# Patient Record
Sex: Male | Born: 2000 | Race: Black or African American | Hispanic: No | Marital: Single
Health system: Northeastern US, Academic
[De-identification: ages and names within clinical notes are randomized; demographics above are authoritative.]

---
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
## 2020-03-09 ENCOUNTER — Encounter: Admit: 2020-03-09 | Payer: PRIVATE HEALTH INSURANCE | Primary: Internal Medicine

## 2020-03-09 ENCOUNTER — Inpatient Hospital Stay: Admit: 2020-03-09 | Discharge: 2020-03-09 | Payer: MEDICAID

## 2020-03-09 DIAGNOSIS — J45909 Unspecified asthma, uncomplicated: Secondary | ICD-10-CM

## 2020-03-09 NOTE — ED Provider Notes
HistoryChief Complaint Patient presents with ? Sore Throat   woke up this am with throat pain, pt has been experiencing nasal congestion with it. no fever reported.  19 year old male here for evaluation of sore throat.  He has had sore throat for last day or 2. He also complains of nasal congestion and postnasal drip.  He mostly has symptoms when he wakes up in the morning.  He describes as burning sensation when he swallows in the morning.  He also complains of sensation of itchy throat.  He had minimal cough.  He denies any fever.  He denies any nausea vomiting.  Patient is status post tonsillectomySore ThroatAssociated symptoms: postnasal drip and rhinorrhea  Associated symptoms: no abdominal pain, no chest pain, no chills, no cough, no fever, no headaches and no shortness of breath   Past Medical History: Diagnosis Date ? Asthma  Past Surgical History: Procedure Laterality Date ? TONSILLECTOMY AND ADENOIDECTOMY   Family History Problem Relation Age of Onset ? No Known Problems Mother  ? No Known Problems Father  Social History Socioeconomic History ? Marital status: Single   Spouse name: Not on file ? Number of children: Not on file ? Years of education: Not on file ? Highest education level: Not on file Tobacco Use ? Smoking status: Never Smoker ? Smokeless tobacco: Never Used Substance and Sexual Activity ? Alcohol use: Not Currently ED Other Social History ? E-cigarette status Never User  ? E-Cigarette Use Never User  E-cigarette/Vaping Substances E-cigarette/Vaping Devices Review of Systems Constitutional: Negative for chills and fever. HENT: Positive for congestion, postnasal drip, rhinorrhea, sneezing and sore throat.  Respiratory: Negative for cough and shortness of breath.  Cardiovascular: Negative for chest pain. Gastrointestinal: Negative for abdominal pain and blood in stool. Genitourinary: Negative for dysuria. Musculoskeletal: Negative for back pain. Neurological: Negative for headaches. All other systems reviewed and are negative. Physical ExamED Triage Vitals [03/09/20 1459]BP: (!) 138/96Pulse: 90Pulse from  O2 sat: n/aResp: 20Temp: 98.6 ?F (37 ?C)Temp src: OralSpO2: 96 % BP (!) 138/96  - Pulse 90  - Temp 98.6 ?F (37 ?C) (Oral)  - Resp 20  - Ht 6' 3 (1.905 m)  - Wt 122.5 kg  - SpO2 96%  - BMI 33.75 kg/m? Physical ExamVitals signs and nursing note reviewed. Constitutional:     Appearance: Normal appearance. HENT:    Head: Atraumatic.    Mouth/Throat:    Mouth: Mucous membranes are moist.    Pharynx: Oropharynx is clear. No pharyngeal swelling or uvula swelling ( Small uvula midline).    Tonsils: No tonsillar exudate. Eyes:    Conjunctiva/sclera: Conjunctivae normal. Neck:    Musculoskeletal: Normal range of motion. Cardiovascular:    Rate and Rhythm: Normal rate and regular rhythm. Pulmonary:    Effort: Pulmonary effort is normal. No respiratory distress.    Breath sounds: Normal breath sounds. Abdominal:    Tenderness: There is no abdominal tenderness. Musculoskeletal:       General: No swelling. Neurological:    General: No focal deficit present.    Mental Status: He is alert.  ProceduresProcedures ED COURSEReviewed previous: previous chartInterpreted by ED Provider: pulse oximetryPatient Reevaluation: 19 year old male with symptoms of sore throat and postnasal drip consistent with environmental allergies.  Will treat accordinglyCritical care provided by attending: no critical carePatient progress: stableClinical Impressions as of Mar 09 1528 Acute pharyngitis, unspecified etiology  ED DispositionNo disposition selected since last refresh of note. Andree Moro, DO04/04/21 1531

## 2020-03-09 NOTE — Discharge Instructions
Continue using your allergy medications including your NasacortTake Tylenol or Motrin as needed for any discomfortAvoid smoking as this can wait make your symptoms worse

## 2021-12-04 ENCOUNTER — Inpatient Hospital Stay: Admit: 2021-12-04 | Discharge: 2021-12-04 | Payer: MEDICAID | Primary: Internal Medicine

## 2021-12-04 ENCOUNTER — Encounter: Admit: 2021-12-04 | Payer: PRIVATE HEALTH INSURANCE | Attending: Internal Medicine | Primary: Internal Medicine

## 2021-12-04 DIAGNOSIS — R634 Abnormal weight loss: Secondary | ICD-10-CM

## 2021-12-04 DIAGNOSIS — R5383 Other fatigue: Secondary | ICD-10-CM

## 2021-12-04 LAB — CBC WITH AUTO DIFFERENTIAL
BKR ALKALINE PHOSPHATASE: 28.5 % (ref 45–117)
BKR WAM ABSOLUTE IMMATURE GRANULOCYTES.: 0.03 x 1000/??L (ref 0.00–0.30)
BKR WAM ABSOLUTE LYMPHOCYTE COUNT.: 2.06 x 1000/??L (ref 0.60–3.70)
BKR WAM ABSOLUTE NEUTROPHIL COUNT.: 3.59 x 1000/??L (ref 2.00–7.60)
BKR WAM BASOPHIL ABSOLUTE COUNT.: 0.05 x 1000/??L — ABNORMAL LOW (ref 5–15)
BKR WAM BASOPHILS: 0.7 % (ref 0.0–1.4)
BKR WAM EOSINOPHIL ABSOLUTE COUNT.: 0.48 x 1000/??L (ref 0.00–1.00)
BKR WAM EOSINOPHILS: 6.6 % — ABNORMAL HIGH (ref 0.0–5.0)
BKR WAM HEMATOCRIT (2 DEC): 43.3 % (ref 38.50–50.00)
BKR WAM HEMOGLOBIN: 14.1 g/dL (ref 13.2–17.1)
BKR WAM IMMATURE GRANULOCYTES: 0.4 % (ref 0.0–1.0)
BKR WAM LYMPHOCYTES: 28.5 % (ref 17.0–50.0)
BKR WAM MCH PG: 29.5 pg (ref 27.0–33.0)
BKR WAM MCV: 90.6 fL (ref 80.0–100.0)
BKR WAM MONOCYTE ABSOLUTE COUNT.: 1.03 x 1000/??L — ABNORMAL HIGH (ref 0.00–1.00)
BKR WAM MONOCYTES: 14.2 % — ABNORMAL HIGH (ref 4.0–12.0)
BKR WAM MPV: 8.9 fL (ref 8.0–12.0)
BKR WAM NEUTROPHILS: 49.6 % (ref 39.0–72.0)
BKR WAM NUCLEATED RED BLOOD CELLS: 0 % (ref 0.0–1.0)
BKR WAM PLATELETS: 340 x1000/??L (ref 150–420)
BKR WAM RDW-CV: 14 % (ref 11.0–15.0)
BKR WAM RED BLOOD CELL COUNT.: 4.78 M/??L (ref 4.00–6.00)
BKR WAM WHITE BLOOD CELL COUNT: 7.2 x1000/??L (ref 4.0–11.0)

## 2021-12-04 LAB — COMPREHENSIVE METABOLIC PANEL
BKR ALANINE AMINOTRANSFERASE (ALT): 42 U/L (ref 16–61)
BKR ALBUMIN: 3.7 g/dL (ref 3.4–5.0)
BKR ANION GAP (LM): 7 mmol/L (ref 5–15)
BKR ASPARTATE AMINOTRANSFERASE (AST): 27 U/L (ref 15–37)
BKR BILIRUBIN TOTAL: 0.3 mg/dL (ref <1.0)
BKR BLOOD UREA NITROGEN: 9 mg/dL (ref 7–18)
BKR CALCIUM: 9.1 mg/dL (ref 8.5–10.1)
BKR CHLORIDE: 103 mmol/L (ref 98–107)
BKR CO2: 30 mmol/L (ref 21–32)
BKR CREATININE: 1 mg/dL (ref 0.70–1.30)
BKR EGFR, CREATININE (CKD-EPI 2021): >60 mL/min/1.73m2 (ref >=60–1.4)
BKR GLOBULIN: 3.6 g/dL (ref 2.5–5.0)
BKR GLUCOSE: 98 mg/dL (ref 65–110)
BKR POTASSIUM: 4.1 mmol/L (ref 3.5–5.1)
BKR PROTEIN TOTAL: 7.3 g/dL (ref 6.4–8.2)
BKR SODIUM: 140 mmol/L (ref 136–145)
BKR WAM MCHC: 9.1 mg/dL (ref 8.5–10.1)

## 2021-12-04 LAB — TSH W/REFLEX TO FT4     (BH GH LMW Q YH): BKR THYROID STIMULATING HORMONE (LM): 2.25 u[IU]/mL (ref 0.36–3.74)

## 2021-12-05 LAB — HEMOGLOBIN A1C: BKR HEMOGLOBIN A1C: 5.4 % (ref 4.2–6.3)

## 2022-04-16 ENCOUNTER — Ambulatory Visit: Admit: 2022-04-16 | Payer: BLUE CROSS/BLUE SHIELD | Primary: Internal Medicine

## 2022-04-16 ENCOUNTER — Encounter: Admit: 2022-04-16 | Payer: PRIVATE HEALTH INSURANCE | Primary: Internal Medicine

## 2022-04-16 ENCOUNTER — Inpatient Hospital Stay: Admit: 2022-04-16 | Discharge: 2022-04-16 | Payer: BLUE CROSS/BLUE SHIELD | Primary: Internal Medicine

## 2022-04-16 DIAGNOSIS — R1110 Vomiting, unspecified: Secondary | ICD-10-CM

## 2022-04-16 DIAGNOSIS — K429 Umbilical hernia without obstruction or gangrene: Secondary | ICD-10-CM

## 2022-04-16 DIAGNOSIS — R109 Unspecified abdominal pain: Secondary | ICD-10-CM

## 2022-04-16 DIAGNOSIS — K567 Ileus, unspecified: Secondary | ICD-10-CM

## 2022-04-16 DIAGNOSIS — Z8709 Personal history of other diseases of the respiratory system: Secondary | ICD-10-CM

## 2022-04-16 DIAGNOSIS — J45909 Unspecified asthma, uncomplicated: Secondary | ICD-10-CM

## 2022-04-16 LAB — COMPREHENSIVE METABOLIC PANEL
BKR ALANINE AMINOTRANSFERASE (ALT): 60 U/L (ref 16–61)
BKR ALBUMIN: 3.7 g/dL (ref 3.4–5.0)
BKR ALKALINE PHOSPHATASE: 67 U/L (ref 45–117)
BKR ANION GAP (LM): 4 mmol/L — ABNORMAL LOW (ref 5–15)
BKR ASPARTATE AMINOTRANSFERASE (AST): 34 U/L (ref 15–37)
BKR BILIRUBIN TOTAL: 0.2 mg/dL (ref <1.0)
BKR BLOOD UREA NITROGEN: 7 mg/dL (ref 7–18)
BKR CALCIUM: 8.8 mg/dL (ref 8.5–10.1)
BKR CHLORIDE: 107 mmol/L (ref 98–107)
BKR CO2: 27 mmol/L (ref 21–32)
BKR CREATININE: 1 mg/dL (ref 0.70–1.30)
BKR EGFR, CREATININE (CKD-EPI 2021): >60 mL/min/{1.73_m2} (ref >=60)
BKR GLOBULIN: 3.6 g/dL (ref 2.5–5.0)
BKR GLUCOSE: 95 mg/dL (ref 65–110)
BKR POTASSIUM: 4 mmol/L (ref 3.5–5.1)
BKR PROTEIN TOTAL: 7.3 g/dL (ref 6.4–8.2)
BKR SODIUM: 138 mmol/L (ref 136–145)

## 2022-04-16 LAB — CBC WITH AUTO DIFFERENTIAL
BKR WAM ABSOLUTE IMMATURE GRANULOCYTES.: 0.03 x 1000/ÂµL (ref 0.00–0.30)
BKR WAM ABSOLUTE LYMPHOCYTE COUNT.: 1.85 x 1000/ÂµL (ref 0.60–3.70)
BKR WAM ABSOLUTE NEUTROPHIL COUNT.: 4.28 x 1000/ÂµL (ref 2.00–7.60)
BKR WAM BASOPHIL ABSOLUTE COUNT.: 0.03 x 1000/ÂµL (ref 0.00–1.00)
BKR WAM BASOPHILS: 0.4 % (ref 0.0–1.4)
BKR WAM EOSINOPHIL ABSOLUTE COUNT.: 0.23 x 1000/ÂµL (ref 0.00–1.00)
BKR WAM EOSINOPHILS: 3.3 % (ref 0.0–5.0)
BKR WAM HEMATOCRIT (2 DEC): 42.7 % (ref 38.50–50.00)
BKR WAM HEMOGLOBIN: 14.1 g/dL (ref 13.2–17.1)
BKR WAM IMMATURE GRANULOCYTES: 0.4 % (ref 0.0–1.0)
BKR WAM LYMPHOCYTES: 26.8 % (ref 17.0–50.0)
BKR WAM MCH PG: 30.6 pg (ref 27.0–33.0)
BKR WAM MCHC: 33 g/dL (ref 31.0–36.0)
BKR WAM MCV: 92.6 fL (ref 80.0–100.0)
BKR WAM MONOCYTE ABSOLUTE COUNT.: 0.49 x 1000/ÂµL (ref 0.00–1.00)
BKR WAM MONOCYTES: 7.1 % (ref 4.0–12.0)
BKR WAM MPV: 8.8 fL (ref 8.0–12.0)
BKR WAM NEUTROPHILS: 62 % (ref 39.0–72.0)
BKR WAM NUCLEATED RED BLOOD CELLS: 0 % (ref 0.0–1.0)
BKR WAM PLATELETS: 353 x1000/ÂµL (ref 150–420)
BKR WAM RDW-CV: 13.2 % (ref 11.0–15.0)
BKR WAM RED BLOOD CELL COUNT.: 4.61 M/ÂµL (ref 4.00–6.00)
BKR WAM WHITE BLOOD CELL COUNT: 6.9 x1000/ÂµL (ref 4.0–11.0)

## 2022-04-16 LAB — LIPASE: BKR LIPASE: 72 U/L — ABNORMAL LOW (ref 73–393)

## 2022-04-16 MED ORDER — ONDANSETRON HCL (PF) 4 MG/2 ML INJECTION SOLUTION
4 mg/2 mL | Freq: Once | INTRAVENOUS | Status: CP
Start: 2022-04-16 — End: ?
  Administered 2022-04-16: 20:00:00 4 mL via INTRAVENOUS

## 2022-04-16 MED ORDER — SODIUM CHLORIDE 0.9 % (FLUSH) INJECTION SYRINGE
0.9 % | INTRAVENOUS | Status: DC | PRN
Start: 2022-04-16 — End: 2022-04-17

## 2022-04-16 MED ORDER — SODIUM CHLORIDE 0.9 % BOLUS (NEW BAG)
0.9 % | Freq: Once | INTRAVENOUS | Status: CP
Start: 2022-04-16 — End: ?
  Administered 2022-04-16: 21:00:00 0.9 mL/h via INTRAVENOUS

## 2022-04-16 MED ORDER — METOCLOPRAMIDE 5 MG/ML INJECTION SOLUTION
5 mg/mL | Freq: Once | INTRAVENOUS | Status: CP
Start: 2022-04-16 — End: ?
  Administered 2022-04-16: 22:00:00 5 mL via INTRAVENOUS

## 2022-04-16 MED ORDER — KETOROLAC 15 MG/ML INJECTION SOLUTION
15 mg/mL | Freq: Once | INTRAVENOUS | Status: CP
Start: 2022-04-16 — End: ?
  Administered 2022-04-16: 20:00:00 15 mL via INTRAVENOUS

## 2022-04-16 MED ORDER — IOHEXOL 350 MG IODINE/ML INTRAVENOUS SOLUTION
350 mg iodine/mL | Freq: Once | INTRAVENOUS | Status: CP | PRN
Start: 2022-04-16 — End: ?
  Administered 2022-04-16: 21:00:00 350 mL via INTRAVENOUS

## 2022-04-16 MED ORDER — ONDANSETRON 4 MG DISINTEGRATING TABLET
4 mg | ORAL_TABLET | Freq: Three times a day (TID) | 1 refills | Status: AC | PRN
Start: 2022-04-16 — End: ?

## 2022-04-16 NOTE — ED Notes
4:40 PM IV placed on left AC.Patient received Toradol, Zofran and Fluids NS bolus. Patient vitals within normal limit, except BP slightly high.Patient was feeling nauseous today. Patient is aware he is going to [place] scan. 4:50 PMPatient is going to [place] scan.

## 2022-04-16 NOTE — Discharge Instructions
You were seen for your symptoms.  You may take over-the-counter Motrin Tylenol for pain or fever as needed.  May take Zofran as needed for nausea.  Drink plenty of fluids and get plenty of rest.  Follow-up with general surgery in 2 to 3 days or return to the ER if you have any worsening symptoms, fevers, chills, persistent vomiting, unable to drink any fluids, or any other worrisome symptoms.

## 2022-04-16 NOTE — ED Notes
3:33 PM Patient states he took a 5mg  prednisone of his mother's this morning for allergies.

## 2022-04-16 NOTE — ED Notes
6:16 PM Mom at bedside.7:18 PMD/C instructions given to patient. Prescriptions explained. Patient encouraged to f/u with PCP and return to ER for any worsening symptoms. PIV removed. Patient A/O, speech clear, on RA, ambulated off unit at d/c. Mom at bedside.

## 2022-04-16 NOTE — ED Provider Notes
HPI21 year old male with past medical history of asthma presents with abdominal pain for the past 2 to 3 days.  Patient states that he had stomach cramps yesterday which he would then noted to have 10 episodes of nonbloody, nonbilious emesis this morning.  States that he is having intermittent, sharp, abdominal pain which occurs about twice a day now and last about 5 minutes.  States that he took an allergy medication and steroids for his allergies.  Denies fevers, chills, chest pain, cold symptoms, shortness breath, testicular pain, penile pain, urinary symptoms, abdominal surgeries, sick contacts.PMH/PSH/FMH/Social Hx, medications and allergies are reviewed as documented in nursing notes.Allergies Allergen Reactions ? Peanut Anaphylaxis ? Tree Nut Anaphylaxis ? Seasonal [Environmental Allergies] Congestion ROS10 systems reviewed, pertinent positives per HPI otherwise noted to be negative.PHYSICAL EXAMVitals:  04/16/22 1529 04/16/22 1752 04/16/22 1753 BP: (!) 148/95 (!) 127/93 (!) 127/93 Pulse: 84 65 71 Resp: 16 20  Temp: 98.4 ?F (36.9 ?C)   TempSrc: Oral   SpO2: 98% 98%  Weight: 115.2 kg   Height: 6' 3 (1.905 m)   GENERAL APPEARANCE: A 21 y.o. male in no acute distress, non-toxic, non-hypoxic in appearance.HEENT: Head normocephalic, atraumatic. PULMONARY: Pt in no respiratory distress. CARDIOVASCULAR: Good capillary refill with no evidence of cyanosis.EXTREMITIES: Atraumatic x4, moving all extremities. No evidence of cyanosis or edema.ABDOMEN:NTTP 4 quadrants.Neg Murphy?s.Neg McBurney?s/Rovsing?s/psoas/obturator.Neg rebound.Neg heeltap.No palpable or pulsatile masses.SKIN: Warm, dry without rash, ecchymosis or erythema.NEUROLOGIC: Alert and oriented. No focal deficits.PSYCHIATRIC: Calm and pleasant affect, cooperative and interactive with staff.LABSI have reviewed all available labs for this visit prior to disposition.Results for orders placed or performed during the hospital encounter of 04/16/22 Lipase Result Value Ref Range  Lipase 72 (L) 73 - 393 U/L Comprehensive metabolic panel Result Value Ref Range  Sodium 138 136 - 145 mmol/L  Potassium 4.0 3.5 - 5.1 mmol/L  Chloride 107 98 - 107 mmol/L  CO2 27 21 - 32 mmol/L  Anion Gap 4 (L) 5 - 15 mmol/L  Glucose 95 65 - 110 mg/dL  BUN 7 7 - 18 mg/dL  Creatinine 1.61 0.96 - 1.30 mg/dL  Calcium 8.8 8.5 - 04.5 mg/dL  Total Protein 7.3 6.4 - 8.2 g/dL  Albumin 3.7 3.4 - 5.0 g/dL  Globulin 3.6 2.5 - 5.0 g/dL  Total Bilirubin 0.2 <4.0 mg/dL  Alkaline Phosphatase 67 45 - 117 U/L  Alanine Aminotransferase (ALT) 60 16 - 61 U/L  Aspartate Aminotransferase (AST) 34 15 - 37 U/L  eGFR (Creatinine) >60 >=60 mL/min/1.33m2 CBC auto differential Result Value Ref Range  WBC 6.9 4.0 - 11.0 x1000/?L  RBC 4.61 4.00 - 6.00 M/?L  Hemoglobin 14.1 13.2 - 17.1 g/dL  Hematocrit 98.11 91.47 - 50.00 %  MCV 92.6 80.0 - 100.0 fL  MCH 30.6 27.0 - 33.0 pg  MCHC 33.0 31.0 - 36.0 g/dL  RDW-CV 82.9 56.2 - 13.0 %  Platelets 353 150 - 420 x1000/?L  MPV 8.8 8.0 - 12.0 fL  Neutrophils 62.0 39.0 - 72.0 %  Lymphocytes 26.8 17.0 - 50.0 %  Monocytes 7.1 4.0 - 12.0 %  Eosinophils 3.3 0.0 - 5.0 %  Basophil 0.4 0.0 - 1.4 %  Immature Granulocytes 0.4 0.0 - 1.0 %  nRBC 0.0 0.0 - 1.0 %  ANC (Abs Neutrophil Count) 4.28 2.00 - 7.60 x 1000/?L  Absolute Lymphocyte Count 1.85 0.60 - 3.70 x 1000/?L  Monocyte Absolute Count 0.49 0.00 - 1.00 x 1000/?L  Eosinophil Absolute Count 0.23 0.00 - 1.00 x 1000/?L  Basophil Absolute Count 0.03  0.00 - 1.00 x 1000/?L  Absolute Immature Granulocyte Count 0.03 0.00 - 0.30 x 1000/?L RADIOLOGYCT Abdomen Pelvis w IV ContrastResult Date: 5/12/2023PROCEDURE:  [place] ABDOMEN PELVIS W IV CONTRAST CLINICAL INDICATION:  Periumbilical pain TECHNIQUE: Volumetric imaging was obtained from the lung bases through the pubic symphysis after the intravenous administration of 75 cc of Omnipaque 350. Oral contrast not employed. Imaging reviewed in the axial, coronal, and sagittal planes. COMPARISON:  No prior pertinent imaging available. FINDINGS: Included lung bases appear free of nodule or infiltrate.  No effusion seen within the included pleural spaces.  Heart size is within normal limits.  No effusion noted within the included pericardium. Liver demonstrates homogeneous enhancement without evidence of mass.  No intrahepatic biliary ductal dilatation noted.  Gallbladder appears unremarkable for modality. No splenic mass noted.  There is no discernible pancreatic mass.  No peripancreatic inflammatory changes noted. No renal mass noted.  No hydronephrosis noted. No adrenal mass noted.  No aortic aneurysm noted.  No significant aortic atherosclerotic disease noted.  No periaortic/caval adenopathy noted.  IVC demonstrates normal caliber. Stomach is poorly distended without discernible mass.  No abnormal small bowel dilatation noted.  Gas is seen scattered in the multiple loops of small bowel. Colon is suboptimally distended.  No pericolonic inflammatory changes are noted.  Vermiform appendix appears within normal limits.  No free intraperitoneal gas or significant free fluid noted.  There is a tiny fat-containing umbilical hernia. Herniated and. Umbilical subcutaneous adipose tissue demonstrates no abnormal stranding.  No pelvic mass noted.  The urinary bladder is suboptimally distended without discernible mass.  No presacral mass noted. No acute osseous fracture noted.  No destructive osseous process noted.  No vertebral body subluxation noted. Bowel gas pattern appears consistent with ileus. No evidence of bowel obstruction or perforated viscus. Small fat-containing umbilical hernia without evidence for associated edematous/inflammatory process. Q6578 - Total number of known Hernandez scans and cardiac nuclear medicine studies within the past 12 months: 1 I6962 - RADIATION DOSE ACQUIRED DURING SCAN: 762.64. [employer] strives for high quality imaging with the lowest possible radiation dose. For more information on medical radiation exposure please visit: [email or web address] . Reported and Signed by:  Herma Ard, MD ED COURSERelevant old records, labs and imaging are reviewed. During the patient's ED course, the patient was given:Medications sodium chloride 0.9 % flush 2.5 mL (has no administration in time range) sodium chloride 0.9 % (new bag) bolus 1,000 mL (0 mLs Intravenous Stopped 04/16/22 1752) ketorolac (TORadol) injection 15 mg (15 mg IV Push Given 04/16/22 1628) ondansetron (PF) (ZOFRAN) injection 4 mg (4 mg IV Push Given 04/16/22 1627) iohexoL (OMNIPAQUE) 350 mg iodine/mL injection 75 mL (75 mLs Intravenous Given 04/16/22 1651) sodium chloride 0.9 % (new bag) bolus 60 mL (0 mLs Intravenous Stopped 04/16/22 1702) metoclopramide (REGLAN) injection 10 mg (10 mg IV Push Given 04/16/22 1748)  Medical Decision Making81 year old male with past medical history of asthma presents with abdominal pain for the past 2 to 3 days.  Denies fevers, chills, chest pain, cold symptoms, shortness breath, testicular pain, penile pain, urinary symptoms, abdominal surgeries, sick contacts.Labs, Jennings abdomen pelvis with contrast pendingPatient treated with IV fluids, Zofran and Toradol.CBC no leukocytosis, no severe anemiaCMP no acute renal failure, no electrolyte abnormalities, no transaminitisLipase unremarkableUA negative for infection or bloodCT AP w/con significant for umbilical hernia and ileus, no other acute intra-abdominal or pelvic pathology.Patient also treated with Reglan 10 milligrams IV.Reviewed labs and imaging with patient, which she verbalized understanding  of.Amount and/or Complexity of Data ReviewedLabs: ordered.Radiology: ordered.RiskPrescription drug management.Nursing notes and vitals reviewed.Pt non-toxic, afebrile, and in no acute distress.VS stable during entire course of ED visit.Plan:OTC Motrin Tylenol.F/U with general surgery in 2-3 days.DCI and indications with which to return were discussed.All questions were answered prior to discharge.CLINICAL IMPRESSION  SNOMED [place](R) 1. Umbilical hernia without obstruction and without gangrene  UMBILICAL HERNIA 2. Ileus (HC Code) (HC CODE) (HC Code)  INTESTINAL OBSTRUCTION CO-OCCURRENT AND DUE TO DECREASED PERISTALSIS DISPOSITIONLamoy SEWELL PITNER was discharged to home in good condition.They have been instructed to follow-up with Cindi Carbon, Judeth Cornfield, MD[address] [phone] in 2 daysIf symptoms worsenCullen, Peter Garter, PASupervisionI have evaluated this patient; My supervising physician was available for consultation.Disclaimer: this chart was created using M-Modal dictation software.  Efforts were made by me to ensure accuracy, however some errors may be present due to limitations of this technology and occasionally words are not transcribed as intended. Evangeline Gula, PA05/12/23 1920

## 2022-04-17 ENCOUNTER — Encounter: Admit: 2022-04-17 | Payer: PRIVATE HEALTH INSURANCE | Primary: Internal Medicine

## 2022-04-17 ENCOUNTER — Inpatient Hospital Stay: Admit: 2022-04-17 | Discharge: 2022-04-18 | Payer: BLUE CROSS/BLUE SHIELD

## 2022-04-17 DIAGNOSIS — J45909 Unspecified asthma, uncomplicated: Secondary | ICD-10-CM

## 2022-04-17 MED ORDER — SODIUM CHLORIDE 0.9 % BOLUS (NEW BAG)
0.9 % | Freq: Once | INTRAVENOUS | Status: CP
Start: 2022-04-17 — End: ?
  Administered 2022-04-18: 03:00:00 0.9 mL/h via INTRAVENOUS

## 2022-04-17 MED ORDER — DICYCLOMINE 10 MG/ML INTRAMUSCULAR SOLUTION
10 mg/mL | Freq: Once | INTRAMUSCULAR | Status: CP
Start: 2022-04-17 — End: ?
  Administered 2022-04-18: 03:00:00 10 mL via INTRAMUSCULAR

## 2022-04-17 MED ORDER — KETOROLAC 15 MG/ML INJECTION SOLUTION
15 mg/mL | Freq: Once | INTRAVENOUS | Status: CP
Start: 2022-04-17 — End: ?
  Administered 2022-04-18: 03:00:00 15 mL via INTRAVENOUS

## 2022-04-17 MED ORDER — METOCLOPRAMIDE 5 MG/ML INJECTION SOLUTION
5 mg/mL | Freq: Once | INTRAVENOUS | Status: CP
Start: 2022-04-17 — End: ?
  Administered 2022-04-18: 03:00:00 5 mL via INTRAVENOUS

## 2022-04-18 LAB — CBC WITH AUTO DIFFERENTIAL
BKR WAM ABSOLUTE IMMATURE GRANULOCYTES.: 0.02 x 1000/ÂµL (ref 0.00–0.30)
BKR WAM ABSOLUTE LYMPHOCYTE COUNT.: 3.08 x 1000/ÂµL (ref 0.60–3.70)
BKR WAM ABSOLUTE NEUTROPHIL COUNT.: 4.21 x 1000/ÂµL (ref 2.00–7.60)
BKR WAM BASOPHIL ABSOLUTE COUNT.: 0.03 x 1000/ÂµL (ref 0.00–1.00)
BKR WAM BASOPHILS: 0.4 % (ref 0.0–1.4)
BKR WAM EOSINOPHIL ABSOLUTE COUNT.: 0.31 x 1000/ÂµL (ref 0.00–1.00)
BKR WAM EOSINOPHILS: 3.8 % (ref 0.0–5.0)
BKR WAM HEMATOCRIT (2 DEC): 41.5 % (ref 38.50–50.00)
BKR WAM HEMOGLOBIN: 13.8 g/dL (ref 13.2–17.1)
BKR WAM IMMATURE GRANULOCYTES: 0.2 % (ref 0.0–1.0)
BKR WAM LYMPHOCYTES: 37.4 % (ref 17.0–50.0)
BKR WAM MCH PG: 30.9 pg (ref 27.0–33.0)
BKR WAM MCHC: 33.3 g/dL (ref 31.0–36.0)
BKR WAM MCV: 93 fL (ref 80.0–100.0)
BKR WAM MONOCYTE ABSOLUTE COUNT.: 0.59 x 1000/ÂµL (ref 0.00–1.00)
BKR WAM MONOCYTES: 7.2 % (ref 4.0–12.0)
BKR WAM MPV: 9.4 fL (ref 8.0–12.0)
BKR WAM NEUTROPHILS: 51 % (ref 39.0–72.0)
BKR WAM NUCLEATED RED BLOOD CELLS: 0 % (ref 0.0–1.0)
BKR WAM PLATELETS: 358 x1000/ÂµL (ref 150–420)
BKR WAM RDW-CV: 13.1 % (ref 11.0–15.0)
BKR WAM RED BLOOD CELL COUNT.: 4.46 M/ÂµL (ref 4.00–6.00)
BKR WAM WHITE BLOOD CELL COUNT: 8.2 x1000/ÂµL (ref 4.0–11.0)

## 2022-04-18 LAB — COMPREHENSIVE METABOLIC PANEL
BKR ALANINE AMINOTRANSFERASE (ALT): 56 U/L (ref 16–61)
BKR ALBUMIN: 3.6 g/dL (ref 3.4–5.0)
BKR ALKALINE PHOSPHATASE: 63 U/L (ref 45–117)
BKR ANION GAP (LM): 6 mmol/L (ref 5–15)
BKR ASPARTATE AMINOTRANSFERASE (AST): 28 U/L (ref 15–37)
BKR BILIRUBIN TOTAL: 0.2 mg/dL (ref <1.0)
BKR BLOOD UREA NITROGEN: 12 mg/dL (ref 7–18)
BKR CALCIUM: 9.1 mg/dL (ref 8.5–10.1)
BKR CHLORIDE: 108 mmol/L — ABNORMAL HIGH (ref 98–107)
BKR CO2: 27 mmol/L (ref 21–32)
BKR CREATININE: 1.05 mg/dL (ref 0.70–1.30)
BKR EGFR, CREATININE (CKD-EPI 2021): >60 mL/min/{1.73_m2} (ref >=60)
BKR GLOBULIN: 3.5 g/dL (ref 2.5–5.0)
BKR GLUCOSE: 103 mg/dL (ref 65–110)
BKR POTASSIUM: 3.9 mmol/L (ref 3.5–5.1)
BKR PROTEIN TOTAL: 7.1 g/dL (ref 6.4–8.2)
BKR SODIUM: 141 mmol/L (ref 136–145)

## 2022-04-18 MED ORDER — DICYCLOMINE 20 MG TABLET
20 mg | ORAL_TABLET | Freq: Four times a day (QID) | ORAL | 1 refills | Status: AC | PRN
Start: 2022-04-18 — End: ?

## 2022-04-18 NOTE — Discharge Instructions
You were seen in the emergency department today for recurrence of abdominal pain after eating.  Your symptoms are most likely due to intestinal ileus.  Recommend clear liquid diet for the next 24 to 48 hours with advancement as tolerated.  You may continue to use Tylenol and Motrin as needed for pain.  You may use the prescription for the antinausea medication that was prescribed at your visit to the emergency department yesterday.  You may also use the prescription for Bentyl for treatment of abdominal pain/cramping every 6 hours as needed.  Follow-up with primary care provider as needed.  Follow-up with surgery for re-evaluation of small umbilical hernia to discuss definitive treatment as needed.  Return for new or worsening symptoms or if you have other concerns.

## 2022-04-18 NOTE — ED Notes
10:46 PM PA Candee at bedside. 12:30 AM+ results from pain/nausea medications (see MAR). Pt sleeping on stretcher, NAD. 2:07 AMDischarge paperwork provided, discussed. Pt expressed understanding.

## 2022-04-19 NOTE — ED Provider Notes
This patient was seen and evaluated by an advanced practice provider. Attending physician was available for consultation during the patient's course in the ED.CHIEF COMPLAINTGeneralized abdominal pain and nausea after eatingHPILamoy PERNELL Colon is a 21 y.o. male  who presents to the ED c/o generalized abdominal pain and nausea after he ate tuna fish this evening.  States that he was evaluated in the emergency department yesterday for similar symptoms in which he is having generalized abdominal pain and nausea and vomiting at that time, had labs and [place] scan was told that he had a hernia causing his symptoms.  He was told that if these symptoms worsened he should come in for re-evaluation.  Patient reports that he was feeling somewhat improved until he tried to eat tuna fish this evening and then he had pain that began about 20 to 30 minutes after eating that has been persistent since onset.  Describes pain as diffuse and aching with associated nausea but no vomiting.  Says that he is not had a bowel movement since yesterday and denies having any diarrhea or bloody or black stool.  No fevers or chills.  Urinating normally.  No medications prior to arrival to the ED.PMH/PSH/FMH/Social Hx, medications and allergies are reviewed as documented in nursing notes.Allergies Allergen Reactions ? Peanut Anaphylaxis ? Tree Nut Anaphylaxis ? Seasonal [Environmental Allergies] Congestion ROS10 systems reviewed, pertinent positives per HPI otherwise noted to be negative.PHYSICAL EXAMPatient Vitals for the past 24 hrs: BP Temp Temp src Pulse Resp SpO2 Weight 04/17/22 2300 135/83 -- -- -- -- 97 % -- 04/17/22 2240 (!) 150/83 98.1 ?F (36.7 ?C) Oral 78 20 98 % 132.4 kg GENERAL APPEARANCE: A 21 y.o. male in no acute distress, non-toxic, non-hypoxic in appearance.HEENT: Head normocephalic, atraumatic. PULMONARY: Pt in no respiratory distress. CTAB.CARDIOVASCULAR: Good capillary refill with no evidence of cyanosis. RRRABDOMEN: Soft, non-distended.  Mildly tender to palpation diffusely over the abdomen. No masses. BSx4. No rebound/guarding/peritoneal signs. Murphy's sign negative.BACK: No CVAT.EXTREMITIES: Atraumatic x4, moving all extremities. No evidence of cyanosis or edema.SKIN: Warm, dry without rash, ecchymosis or erythema.NEUROLOGIC: Alert and oriented. No focal deficits.PSYCHIATRIC: Calm and pleasant affect, cooperative and interactive with staff.LABSI have reviewed all available labs for this visit prior to disposition.Results for orders placed or performed during the hospital encounter of 04/17/22 Comprehensive metabolic panel Result Value Ref Range  Sodium 141 136 - 145 mmol/L  Potassium 3.9 3.5 - 5.1 mmol/L  Chloride 108 (H) 98 - 107 mmol/L  CO2 27 21 - 32 mmol/L  Anion Gap 6 5 - 15 mmol/L  Glucose 103 65 - 110 mg/dL  BUN 12 7 - 18 mg/dL  Creatinine 1.19 1.47 - 1.30 mg/dL  Calcium 9.1 8.5 - 82.9 mg/dL  Total Protein 7.1 6.4 - 8.2 g/dL  Albumin 3.6 3.4 - 5.0 g/dL  Globulin 3.5 2.5 - 5.0 g/dL  Total Bilirubin 0.2 <5.6 mg/dL  Alkaline Phosphatase 63 45 - 117 U/L  Alanine Aminotransferase (ALT) 56 16 - 61 U/L  Aspartate Aminotransferase (AST) 28 15 - 37 U/L  eGFR (Creatinine) >60 >=60 mL/min/1.50m2 CBC auto differential Result Value Ref Range  WBC 8.2 4.0 - 11.0 x1000/?L  RBC 4.46 4.00 - 6.00 M/?L  Hemoglobin 13.8 13.2 - 17.1 g/dL  Hematocrit 21.30 86.57 - 50.00 %  MCV 93.0 80.0 - 100.0 fL  MCH 30.9 27.0 - 33.0 pg  MCHC 33.3 31.0 - 36.0 g/dL  RDW-CV 84.6 96.2 - 95.2 %  Platelets 358 150 - 420 x1000/?L  MPV 9.4 8.0 -  12.0 fL  Neutrophils 51.0 39.0 - 72.0 %  Lymphocytes 37.4 17.0 - 50.0 %  Monocytes 7.2 4.0 - 12.0 %  Eosinophils 3.8 0.0 - 5.0 %  Basophil 0.4 0.0 - 1.4 %  Immature Granulocytes 0.2 0.0 - 1.0 %  nRBC 0.0 0.0 - 1.0 %  ANC (Abs Neutrophil Count) 4.21 2.00 - 7.60 x 1000/?L Absolute Lymphocyte Count 3.08 0.60 - 3.70 x 1000/?L  Monocyte Absolute Count 0.59 0.00 - 1.00 x 1000/?L  Eosinophil Absolute Count 0.31 0.00 - 1.00 x 1000/?L  Basophil Absolute Count 0.03 0.00 - 1.00 x 1000/?L  Absolute Immature Granulocyte Count 0.02 0.00 - 0.30 x 1000/?L RADIOLOGYNo orders to display ED COURSERelevant old records, labs and imaging are reviewed. During the patient's ED course, the patient was given:Medications ketorolac (TORadol) injection 15 mg (15 mg IV Push Given 04/17/22 2305) metoclopramide (REGLAN) injection 10 mg (10 mg Intravenous Given 04/17/22 2305) dicyclomine (BENTYL) injection 20 mg (20 mg Intramuscular Given 04/17/22 2305) sodium chloride 0.9 % (new bag) bolus 500 mL (0 mLs Intravenous Stopped 04/18/22 0144)  MDMLamoy O Heffron is a 21 y.o. male presents to the Emergency Department c/o recurrence of diffuse abdominal pain and nausea after he attempted to eat tuna fish this evening.  He was concerned that his umbilical hernia is worsening and came back for evaluation.  Patient was evaluated just yesterday for nausea, vomiting, and diffuse abdominal pain, had [place] scan demonstrating intestinal ileus and small fat containing umbilical hernia.  He apparently was given medications which improved his pain discharge home with Zofran and was feeling well until he attempted to eat this evening. Vital signs are stable and he is afebrile.  Overall appears well, nontoxic in appearance.  Exam today is unremarkable with mild diffuse tenderness without peritoneal signs and positive bowel sounds.  Remainder of exam is unremarkable.  His repeat labs today are without abnormality.  Discussed with the patient that his symptoms have recurrence of pain after eating are more likely related to spasm secondary to eating likely in regards to current ileus.  He says that he has not really been passing much gas and has not had a bowel movement since yesterday.  Advised on clear liquid diet.  He was administered antinausea medication, Toradol, dicyclomine and fluid bolus and felt improved.  He was advised on continued use of Bentyl, clear liquid diet, use of the prescribed Zofran that was prescribed yesterday at his visit and close follow-up with primary care provider.  He was advised that he may also follow-up with surgery as an outpatient for elective repair of the small umbilical hernia.  Patient is discharged in good condition.Medications prescribed at this visit:1) Bentyl 20mg  q6h prnDCI and indications with which to return were discussed and all questions were answered prior to discharge.CLINICAL IMPRESSION  SNOMED [place](R) 1. Ileus (HC Code) (HC CODE) (HC Code)  INTESTINAL OBSTRUCTION CO-OCCURRENT AND DUE TO DECREASED PERISTALSIS 2. Generalized abdominal pain  GENERALIZED ABDOMINAL PAIN 3. Nausea  NAUSEA 4. Umbilical hernia without obstruction and without gangrene  UMBILICAL HERNIA DISPOSITIONLamoy Ramiro Harvest was discharged to home in good condition.They have been instructed to follow-up with Charlott Holler [address] [phone]: This chart was created using M-Modal dictation software. Efforts were made by me to ensure accuracy, however some errors may be present due to limitations of this technology and occasionally words are not transcribed as intended. Caswell Corwin, PA05/14/23 2209

## 2022-06-17 ENCOUNTER — Institutional Professional Consult (permissible substitution)
Admit: 2022-06-17 | Payer: BLUE CROSS/BLUE SHIELD | Attending: Student in an Organized Health Care Education/Training Program | Primary: Internal Medicine

## 2022-06-17 DIAGNOSIS — K429 Umbilical hernia without obstruction or gangrene: Secondary | ICD-10-CM

## 2022-06-17 MED ORDER — SERTRALINE 50 MG TABLET
50 mg | ORAL | Status: AC
Start: 2022-06-17 — End: ?

## 2022-06-17 MED ORDER — PANTOPRAZOLE 40 MG TABLET,DELAYED RELEASE
40 mg | Status: AC
Start: 2022-06-17 — End: ?

## 2022-06-17 NOTE — Other
MRN: [id number] Date of Encounter: 7/13/2023Indication for Visit:  Umbilical HerniaReferred by: Evan Hey, PA   HPI:  21yo obese man with well-controlled asthma presenting for evaluation of umbilical hernia. Was seen in [place] two months ago with crampy abdominal pain and emesis; work-up included labs that were unremarkable and a CTAP with no acute findings and a small fat-containing umbilical hernia. Prior to this CTAP, Evan Colon was not aware that he had a hernia. Reports that he still occasionally has crampy abdominal pain but improved with rx for bentyl; no nausea or vomiting, no diarrhea or constipation. Will be seeing GI in future due to significant GERD, for which he is taking pantoprazole with dose recently increased to BID. No cp/sob. Only has rescue inhaler for asthma and cannot remember the last time he used itSnores at night and mother sometimes has to wake up him to have him turn; had sleep study in his teens, no OSA at that time.No tobacco useRare EtOHDaily marijuana smokerET> 4 METSPAST MEDICAL: Past Medical History: Diagnosis Date ? Asthma  SURGICAL HISTORY:  Past Surgical History: Procedure Laterality Date ? TONSILLECTOMY AND ADENOIDECTOMY   MEDICATIONS:  Current Outpatient Medications Medication Sig ? ALBUTEROL INHL Inhale into the lungs. ? cetirizine (ZYRTEC) 10 mg tablet Take 1 tablet (10 mg total) by mouth daily. ? dicyclomine (BENTYL) 20 mg tablet Take 1 tablet (20 mg total) by mouth every 6 (six) hours as needed (abdominal pain/cramping). ? pantoprazole (PROTONIX) 40 mg tablet  ? sertraline (ZOLOFT) 50 mg tablet Take 1 tablet (50 mg total) by mouth daily. No current facility-administered medications for this visit. ALLERGIES: is allergic to peanut, peanut oil, tree nut, and seasonal [environmental allergies]. FAMILY HISTORY:   Family History Problem Relation Age of Onset ? No Known Problems Mother  ? No Known Problems Father  	SOCIAL HISTORY:  He reports that he has never smoked. He has never used smokeless tobacco. He reports that he does not currently use alcohol. No history on file for drug use.REVIEW OF SYSTEMS: Negative except as per HPIPHYSICAL EXAMINATION:VITAL SIGNS: BP (!) 157/81  - Pulse 89  - Ht 6' 3 (1.905 m)  - Wt 123.4 kg  - BMI 34.00 kg/m?   Alert and in no acute distressHR normal rate, regular rhythmBreathing comfortably on room airAbdomen obese, soft; small umbilical hernia, ~103fb defect, tender with deep palpation at hernia only, otherwise non-tenderSkin is warm, dryIMAGING:CTAP 04/16/2022 reviewed - small fat-containing umbilical hernia, measures ~1x1cmIMPRESSIONS & RECOMMENDATIONS:   63M with small, asymptomatic fat-containing umbilical hernia. We discussed nature and management of abdominal wall hernias including watchful waiting vs repair. Mr. Kohles and his mother, [place], expressed preference for repair. We also discussed open vs minimally invasive approaches to repair including expected post-op course and recovery period. Mr. Beeks hernia is small enough that it may be possible to repair primarily if there is no surrounding weakness to abdominal wall; given this, I have recommended open primary repair rather than robotic preperitoneal repair, which would entail three incisions bigger than the hernia defect itself. We discussed risks/benefits/alternatives and he agrees to proceed. All their questions were answered to their satisfaction. - Will schedule for OR pending availability. - Signed consent obtained in office- Labs 5/12 & 13 reviewed; no additional testing neededTania Torres-Sanchez, MDAssistant Professor of SurgeryGeneral Surgery, Trauma and Surgical Critical Care7/13/2023 11:18 AM

## 2022-06-23 ENCOUNTER — Encounter
Admit: 2022-06-23 | Payer: PRIVATE HEALTH INSURANCE | Attending: Student in an Organized Health Care Education/Training Program | Primary: Internal Medicine

## 2022-06-23 DIAGNOSIS — J45909 Unspecified asthma, uncomplicated: Secondary | ICD-10-CM

## 2022-07-01 ENCOUNTER — Encounter
Admit: 2022-07-01 | Payer: PRIVATE HEALTH INSURANCE | Attending: Student in an Organized Health Care Education/Training Program | Primary: Internal Medicine

## 2022-07-01 ENCOUNTER — Ambulatory Visit: Admit: 2022-07-01 | Payer: BLUE CROSS/BLUE SHIELD | Attending: Anesthesiology | Primary: Internal Medicine

## 2022-07-01 DIAGNOSIS — J45909 Unspecified asthma, uncomplicated: Secondary | ICD-10-CM

## 2022-07-02 ENCOUNTER — Inpatient Hospital Stay
Admit: 2022-07-02 | Discharge: 2022-07-02 | Payer: BLUE CROSS/BLUE SHIELD | Attending: Student in an Organized Health Care Education/Training Program | Primary: Internal Medicine

## 2022-07-02 ENCOUNTER — Encounter
Admit: 2022-07-02 | Payer: PRIVATE HEALTH INSURANCE | Attending: Student in an Organized Health Care Education/Training Program | Primary: Internal Medicine

## 2022-07-02 ENCOUNTER — Ambulatory Visit: Admit: 2022-07-02 | Payer: BLUE CROSS/BLUE SHIELD | Attending: Anesthesiology | Primary: Internal Medicine

## 2022-07-02 DIAGNOSIS — F419 Anxiety disorder, unspecified: Secondary | ICD-10-CM

## 2022-07-02 DIAGNOSIS — Z6834 Body mass index (BMI) 34.0-34.9, adult: Secondary | ICD-10-CM

## 2022-07-02 DIAGNOSIS — E669 Obesity, unspecified: Secondary | ICD-10-CM

## 2022-07-02 DIAGNOSIS — J45909 Unspecified asthma, uncomplicated: Secondary | ICD-10-CM

## 2022-07-02 DIAGNOSIS — K429 Umbilical hernia without obstruction or gangrene: Secondary | ICD-10-CM

## 2022-07-02 DIAGNOSIS — G4730 Sleep apnea, unspecified: Secondary | ICD-10-CM

## 2022-07-02 DIAGNOSIS — Z79899 Other long term (current) drug therapy: Secondary | ICD-10-CM

## 2022-07-02 MED ORDER — ACETAMINOPHEN 325 MG TABLET
325 mg | ORAL | Status: DC | PRN
Start: 2022-07-02 — End: 2022-07-02

## 2022-07-02 MED ORDER — ONDANSETRON HCL (PF) 4 MG/2 ML INJECTION SOLUTION
42 mg/2 mL | INTRAVENOUS | Status: DC | PRN
Start: 2022-07-02 — End: 2022-07-02

## 2022-07-02 MED ORDER — ONDANSETRON HCL (PF) 4 MG/2 ML INJECTION SOLUTION
4 mg/2 mL | Status: CP
Start: 2022-07-02 — End: ?

## 2022-07-02 MED ORDER — PROCHLORPERAZINE EDISYLATE 10 MG/2 ML (5 MG/ML) INJECTION SOLUTION
1025 mg/2 mL (5 mg/mL) | INTRAVENOUS | Status: DC | PRN
Start: 2022-07-02 — End: 2022-07-02

## 2022-07-02 MED ORDER — SUGAMMADEX 100 MG/ML INTRAVENOUS SOLUTION
100 mg/mL | INTRAVENOUS | Status: DC | PRN
Start: 2022-07-02 — End: 2022-07-02
  Administered 2022-07-02: 17:00:00 100 mg/mL via INTRAVENOUS

## 2022-07-02 MED ORDER — LIDOCAINE (PF) 20 MG/ML (2 %) INTRAVENOUS SOLUTION
20 mg/mL (2 %) | INTRAVENOUS | Status: DC | PRN
Start: 2022-07-02 — End: 2022-07-02
  Administered 2022-07-02: 16:00:00 20 mg/mL (2 %) via INTRAVENOUS

## 2022-07-02 MED ORDER — FENTANYL (PF) 50 MCG/ML INJECTION SOLUTION
50 mcg/mL | INTRAVENOUS | Status: DC | PRN
Start: 2022-07-02 — End: 2022-07-02
  Administered 2022-07-02 (×2): 50 mcg/mL via INTRAVENOUS

## 2022-07-02 MED ORDER — ACETAMINOPHEN 1,000 MG/100 ML (10 MG/ML) INTRAVENOUS SOLUTION
10 mg/mL | Freq: Once | INTRAVENOUS | Status: CP
Start: 2022-07-02 — End: ?
  Administered 2022-07-02: 18:00:00 10 mL/h via INTRAVENOUS

## 2022-07-02 MED ORDER — HEPARIN (PORCINE) 5,000 UNIT/ML INJECTION SOLUTION
5000 unit/mL | Status: CP
Start: 2022-07-02 — End: ?

## 2022-07-02 MED ORDER — CEFAZOLIN IV PUSH 1 GRAM VIAL & 0.9% SODIUM CHLORIDE (ADULT)
Freq: Once | INTRAVENOUS | Status: DC
Start: 2022-07-02 — End: 2022-07-02

## 2022-07-02 MED ORDER — SODIUM CHLORIDE 0.9 % (FLUSH) INJECTION SYRINGE
0.9 % | Freq: Three times a day (TID) | INTRAVENOUS | Status: DC
Start: 2022-07-02 — End: 2022-07-02

## 2022-07-02 MED ORDER — SODIUM CHLORIDE 0.9 % (FLUSH) INJECTION SYRINGE
0.9 % | INTRAVENOUS | Status: DC | PRN
Start: 2022-07-02 — End: 2022-07-02

## 2022-07-02 MED ORDER — PROPOFOL 10 MG/ML INTRAVENOUS EMULSION
10 mg/mL | Status: CP
Start: 2022-07-02 — End: ?

## 2022-07-02 MED ORDER — HYDROMORPHONE 0.5 MG/0.5 ML INJECTION SYRINGE
0.50.5 mg/ mL | INTRAVENOUS | Status: DC | PRN
Start: 2022-07-02 — End: 2022-07-02

## 2022-07-02 MED ORDER — OXYCODONE IMMEDIATE RELEASE 5 MG TABLET
5 mg | ORAL | Status: DC | PRN
Start: 2022-07-02 — End: 2022-07-02

## 2022-07-02 MED ORDER — FENTANYL (PF) 50 MCG/ML INJECTION SOLUTION
50 mcg/mL | Status: CP
Start: 2022-07-02 — End: ?

## 2022-07-02 MED ORDER — BUPIVACAINE (PF) 0.25 % (2.5 MG/ML) INJECTION SOLUTION
0.252.5 % (2.5 mg/mL) | Status: DC
Start: 2022-07-02 — End: 2022-07-02

## 2022-07-02 MED ORDER — ROCURONIUM 10 MG/ML INTRAVENOUS SOLUTION
10 mg/mL | Status: CP
Start: 2022-07-02 — End: ?

## 2022-07-02 MED ORDER — CHLORHEXIDINE GLUCONATE 0.12 % MOUTHWASH
0.12 % | Freq: Once | OROMUCOSAL | Status: CP
Start: 2022-07-02 — End: ?
  Administered 2022-07-02: 15:00:00 0.12 mL via OROMUCOSAL

## 2022-07-02 MED ORDER — DEXAMETHASONE SODIUM PHOSPHATE 4 MG/ML INJECTION SOLUTION
4 mg/mL | Status: CP
Start: 2022-07-02 — End: ?

## 2022-07-02 MED ORDER — KETOROLAC 30 MG/ML (1 ML) INJECTION SOLUTION
30 mg/mL (1 mL) | Status: CP
Start: 2022-07-02 — End: ?

## 2022-07-02 MED ORDER — BUPIVACAINE (PF) 0.25 % (2.5 MG/ML) INJECTION SOLUTION
0.25 % (2.5 mg/mL) | Status: DC | PRN
Start: 2022-07-02 — End: 2022-07-02
  Administered 2022-07-02: 16:00:00 0.25 % (2.5 mg/mL)

## 2022-07-02 MED ORDER — FENTANYL (PF) 50 MCG/ML INJECTION SOLUTION
50 mcg/mL | INTRAVENOUS | Status: DC | PRN
Start: 2022-07-02 — End: 2022-07-02
  Administered 2022-07-02: 17:00:00 50 mL via INTRAVENOUS

## 2022-07-02 MED ORDER — FENTANYL (PF) 50 MCG/ML INJECTION SOLUTION
50 mcg/mL | INTRAVENOUS | Status: DC | PRN
Start: 2022-07-02 — End: 2022-07-02

## 2022-07-02 MED ORDER — HYDROMORPHONE (PF) 0.2 MG/ML INJECTION SYRINGE
0.2 mg/mL | INTRAVENOUS | Status: DC | PRN
Start: 2022-07-02 — End: 2022-07-02

## 2022-07-02 MED ORDER — ONDANSETRON HCL (PF) 4 MG/2 ML INJECTION SOLUTION
4 mg/2 mL | INTRAVENOUS | Status: DC | PRN
Start: 2022-07-02 — End: 2022-07-02
  Administered 2022-07-02: 16:00:00 4 mg/2 mL via INTRAVENOUS

## 2022-07-02 MED ORDER — KETOROLAC 30 MG/ML (1 ML) INJECTION SOLUTION
30 mg/mL (1 mL) | INTRAVENOUS | Status: DC | PRN
Start: 2022-07-02 — End: 2022-07-02
  Administered 2022-07-02: 16:00:00 30 mg/mL (1 mL) via INTRAVENOUS

## 2022-07-02 MED ORDER — OXYCODONE IMMEDIATE RELEASE 5 MG TABLET
5 mg | ORAL_TABLET | Freq: Four times a day (QID) | ORAL | 1 refills | Status: AC | PRN
Start: 2022-07-02 — End: 2022-11-16

## 2022-07-02 MED ORDER — NALOXONE 0.4 MG/ML INJECTION SOLUTION
0.4 mg/mL | INTRAVENOUS | Status: DC | PRN
Start: 2022-07-02 — End: 2022-07-02

## 2022-07-02 MED ORDER — CHLORHEXIDINE GLUCONATE 2 % TOWELETTE
2 % | Freq: Once | TOPICAL | Status: CP
Start: 2022-07-02 — End: ?
  Administered 2022-07-02: 14:00:00 2 % via TOPICAL

## 2022-07-02 MED ORDER — CEFAZOLIN IV PUSH 1 GRAM VIAL & 0.9% SODIUM CHLORIDE (ADULT)
Freq: Once | INTRAVENOUS | Status: CP
Start: 2022-07-02 — End: ?
  Administered 2022-07-02: 16:00:00 30.000 mL via INTRAVENOUS

## 2022-07-02 MED ORDER — PROPOFOL 10 MG/ML INTRAVENOUS EMULSION
10 mg/mL | INTRAVENOUS | Status: DC | PRN
Start: 2022-07-02 — End: 2022-07-02
  Administered 2022-07-02 (×2): 10 mg/mL via INTRAVENOUS

## 2022-07-02 MED ORDER — OXYCODONE-ACETAMINOPHEN 5 MG-325 MG TABLET
5-325 mg | Freq: Once | ORAL | Status: DC | PRN
Start: 2022-07-02 — End: 2022-07-02

## 2022-07-02 MED ORDER — DEXAMETHASONE SODIUM PHOSPHATE 4 MG/ML INJECTION SOLUTION
4 mg/mL | INTRAVENOUS | Status: DC | PRN
Start: 2022-07-02 — End: 2022-07-02
  Administered 2022-07-02: 16:00:00 4 mg/mL via INTRAVENOUS

## 2022-07-02 MED ORDER — LACTATED RINGERS INTRAVENOUS SOLUTION
INTRAVENOUS | Status: DC
Start: 2022-07-02 — End: 2022-07-02
  Administered 2022-07-02: 15:00:00 1000.000 mL/h via INTRAVENOUS

## 2022-07-02 MED ORDER — HEPARIN (PORCINE) 5,000 UNIT/ML INJECTION SOLUTION
5000 unit/mL | Freq: Once | SUBCUTANEOUS | Status: CP
Start: 2022-07-02 — End: ?
  Administered 2022-07-02: 16:00:00 via SUBCUTANEOUS

## 2022-07-02 MED ORDER — SODIUM CHLORIDE 0.9 % IRRIGATION SOLUTION
0.9 % irrigation | Status: CP | PRN
Start: 2022-07-02 — End: ?
  Administered 2022-07-02: 16:00:00 0.9 % irrigation

## 2022-07-02 MED ORDER — MIDAZOLAM (PF) 1 MG/ML INJECTION SOLUTION
1 mg/mL | Status: CP
Start: 2022-07-02 — End: ?

## 2022-07-02 MED ORDER — ROCURONIUM 10 MG/ML INTRAVENOUS SOLUTION
10 mg/mL | INTRAVENOUS | Status: DC | PRN
Start: 2022-07-02 — End: 2022-07-02
  Administered 2022-07-02: 16:00:00 10 mg/mL via INTRAVENOUS

## 2022-07-02 MED ORDER — DEXMEDETOMIDINE BOLUS (PEDIATRIC)
INTRAVENOUS | Status: DC | PRN
Start: 2022-07-02 — End: 2022-07-02
  Administered 2022-07-02 (×3): via INTRAVENOUS

## 2022-07-02 MED ORDER — MIDAZOLAM 1 MG/ML INJECTION SOLUTION
1 mg/mL | INTRAVENOUS | Status: DC | PRN
Start: 2022-07-02 — End: 2022-07-02
  Administered 2022-07-02: 16:00:00 1 mg/mL via INTRAVENOUS

## 2022-07-02 MED ORDER — LIDOCAINE (PF) 20 MG/ML (2 %) INTRAVENOUS SOLUTION
20 mg/mL (2 %) | Status: CP
Start: 2022-07-02 — End: ?

## 2022-07-02 MED ORDER — LACTATED RINGERS INTRAVENOUS SOLUTION
INTRAVENOUS | Status: DC
Start: 2022-07-02 — End: 2022-07-02

## 2022-07-02 NOTE — Anesthesia Pre-Procedure Evaluation
This is a 21 y.o. male scheduled for OPEN UMBILICAL HERNIA REPAIR.Review of Systems/ Medical HistoryPatient summary, nursing notes, Labs, pre-procedure vitals, height, weight and NPO status reviewed.No previous anesthesia concernsAnesthesia Evaluation:   No history of anesthetic complications  Estimated body mass index is 34 kg/m? as calculated from the following:  Height as of 06/17/22: 6' 3 (1.905 m).  Weight as of 06/17/22: 123.4 kg. CC/HPI: Past Medical HistoryNo date: AsthmaPast Surgical History:  Past Surgical History:No date: TONSILLECTOMY AND ADENOIDECTOMYRespiratory: -Airway disorders:      -Asthma: yesGastrointestinal/Genitourinary: -Nutritional Disorders: Pt is obese per BMI definition-Physical ExamCardiovascular:    normal exam  Pulmonary:  normal exam  Airway:  Mallampati: IITM distance: >3 FBNeck ROM: fullDental:  unremarkable  Anesthesia PlanASA 2 The primary anesthesia plan is  general ETT. Anesthesia informed consent obtained. Anesthesia written consent obtainedConsent obtained from: patientPlan discussed with CRNA.Anesthesiologist's Pre Op NoteI personally evaluated and examined the patient prior to the intra-operative phase of care on the day of the procedure.Marland Kitchen

## 2022-07-02 NOTE — Other
PATIENT NAMEPeydon Colon [id number] DATE: 7/28/2023Pre-operative Diagnosis:Pre-Op Diagnosis Codes:   * Umbilical hernia without obstruction and without gangrene [K42.9]Post-operative Diagnosis: sameSurgeon: Surgeon(s) and Role:   * [name], MD - Primary Assistant: PA 1st Assist: Glenetta Borg, PAProcedure and Anesthesia:  Procedure(s) and Anesthesia Type:   * OPEN UMBILICAL HERNIA REPAIR - GENERALASA Class: 2PA justification statement:  The assistance of my physician's assistant PA 1st Assist: Kronisch, Louis M, [place]  was essential for safe exposure, retraction, and manipulation of vital structures during this complex operative procedure as there are no qualified residents at this institution. Procedure Details: A?surgical pause was performed in accordance with hospital regulations. The patient was placed under general anesthesia supine on the operating table. Antibiotics were administered prior to the procedure. The abdomen was prepped with?Chloraprep and draped in usual sterile fashion. After time-out and infiltration of local anesthetic, I made a curved supraumbilical incision carrying dissection down to fascia with electrocautery. Umbilical stalk was encircled via blunt dissection, and separated from preperitoneal fat through hernia defect with electrocautery. Fascial defect was noted to be just under 1cm and, given size, I decided not use mesh. Defect was repaired primarily with one 0 vicryl figure-of-eight. The umbilical stalk was then reattached to fascia with 2-0 vicryl and the wound was then?closed in layers. Skin was closed with 4-0 Monocryl subcuticularly?and dermabond.?Findings:Umbilical hernia measuring <1cm, repaired primarily Estimated Blood Loss:  Minimal       Drains: none       Total IV Fluids: 1000 ml       Specimens: none       Complications: none       Disposition: PACU - hemodynamically stable. [name], MDAssistant Professor of SurgeryGeneral Surgery, Trauma and Surgical Critical Care7/28/2023 12:59 PM

## 2022-07-02 NOTE — Interval H&P Note
Subsequent to admission for surgery or invasive procedure, I have reassessed the patient by examination and review of relevant data pertaining to the planned procedure. I have verified the planned procedure and there are no relevant changes since the H&P. Evan Colon, MDAssistant Professor of SurgeryGeneral Surgery, Trauma and Surgical Critical Care7/28/2023 11:06 AM

## 2022-07-02 NOTE — Brief Op Note
[facility] HealthPatient Name: Evan Colon        [id number] Patient DOB: May 19, 2001     Surgery Date: 7/28/2023Surgeon(s) and Role:   [name], MD - PrimaryAssistant(s):1st Asst:  Glenetta Borg PAC/  Gentry Fitz PACPAs assisted with incision, repair umbilical hernia, and closure.Staff:  Circulator: Doristine Johns, RN; Ethlyn Gallery, RNRelief Circulator: Delene Ruffini, JuanitaRelief Scrub: Yates Decamp, DajanaScrub Person: Arlis Porta Diagnosis: * No pre-op diagnosis entered * : Umbilical hernia. Procedure(s) and Anesthesia Type:   * OPEN UMBILICAL HERNIA REPAIR - GENERALOperative Findings (enter relevant operative findings; do not refer to an operative report that is not yet transcribed): Umb. Hernia repair.Signs of infection present at the time of surgery at the operative site: None Blood and Blood Products: none                 Drains:  noneImplants: * No implants in log * Specimens: * No specimens in log * Clinical Staging: .EBL: 5 cc.    Post Operative Diagnosis: * No post-op diagnosis entered * Evan Colon, PA7/28/202312:48 PM

## 2022-07-02 NOTE — Discharge Instructions
Surgery Discharge Instructions Diet- You can eat a regular diet, however, it is not uncommon that your appetite be reduced after receiving general anesthesiaDischarge Medications- Pain Medication: Take three tablets of 325 mg acetaminophen or two tablets of 500 mg acetaminophen every six hours for the first few days. DO NOT TAKE more than 4,000 mg of acetaminophen in a day.  You can also take ibuprofen 400 mg every eight hours, alternating with acetaminophen.- For severe pain not controlled with acetaminophen and ibuprofen,take oxycodone as prescribed. Do not drive if you are taking narcotics.- Bowel regimen: If you are taking narcotics, take miralax or senna daily to avoid constipation; make sure you are drinking plenty of water.Activity - No heavy lifting, pushing, or pulling until advised by Dr. Jeneen Montgomery.- Do not lift greater than 15lbs for 4 weeks.-We encourage you to ambulate and carry out your daily living activities.- Daily walking is recommended after surgery.- May walk up and down stairs, one step at a time.- No push ups or sit ups.- Be out of bed as much as you can.- Continue to use your Incentive Spirometer (breathing device) after discharge to help prevent pneumonia. Hydration- Hydration following surgery is very important.- You should drink at least 1-2 liters of fluid a day. Avoid alcohol and soda.Incision/Wound Care- Always wash your hands for 30 seconds before and after touching any incision.- You may shower but do not submerge your wounds in water (baths, hot tubs, pools, ocean, etc).- No bath tubs, hot tubs, ocean, pools, or swimming until advised by MD.- Shower with antimicrobial soap, allowing the soap and water to run over your wounds. Gently pat the wounds dry after. You do not need to cover your incisions. Do not scrub your wounds. - Do not pick or peel at skin glue, it will fall off on their own.  You may shower over them, just blot dry after.  - If any incision begins draining a lot, develops a foul odor, becomes more tender or becomes reddened, please call the doctor?s office.Travel- No driving while taking narcotic pain medications- Please discuss any plans for travel with your surgeonCall the doctor?s office and report any of the following:- Bleeding occurs or continues- Chest pain- Shortness of breath- Increased or new abdominal pain- Feeling your heart is racing- Incision opens up- Incision redness, warmth, swelling, bleeding, drainage- Lightheadedness, dizziness or fainting- Persistent nausea, vomiting- New or increased fatigue or weakness- Temperature greater than 101 degrees F for more than 24 hours- Shaking chills- Pain unrelieved by pain medicationsPlease, call Dr.Torres-Sanchez office to schedule an appointment within 10-14 days of discharge. If you have any concerns at all, please do not hesitate to call the office. We wish you well in your recovery.

## 2022-07-02 NOTE — Other
Pt complains of 6/10 pain, medicated with 0.45mcg IV fentanyl per order.

## 2022-07-02 NOTE — Other
Operative Diagnosis:Pre-op:   * No pre-op diagnosis entered * Patient Coded Diagnosis   Pre-op diagnosis: Umbilical hernia without obstruction and without gangrene  Post-op diagnosis: Umbilical hernia without obstruction and without gangrene  Patient Diagnosis   None    Post-op diagnosis:   * Umbilical hernia without obstruction and without gangrene [Z61.9]Operative Procedure(s) :Procedure(s) (LRB):OPEN UMBILICAL HERNIA REPAIR (N/A)Post-op Procedure & Diagnosis ConfirmationPost-op Diagnosis: Post-op Diagnosis confirmed (no changes)Post-op Procedure: Post-op Procedure confirmed (no changes)

## 2022-07-02 NOTE — Anesthesia Post-Procedure Evaluation
Anesthesia Post-op NotePatient: Evan O LevyProcedure(s):  Procedure(s) (LRB):OPEN UMBILICAL HERNIA REPAIR (N/A) Patient location: PACULast Vitals:  I have noted the vital signs as listed in the nursing notes.Mental status recovered: patient participates in evaluation: YesVital signs reviewed: YesRespiratory function stable:YesAirway is patent: YesCardiovascular function and hydration status stable: YesPain control satisfactory: YesNausea and vomiting control satisfactory:YesNo notable events documented.

## 2022-07-14 ENCOUNTER — Encounter
Admit: 2022-07-14 | Payer: BLUE CROSS/BLUE SHIELD | Attending: Student in an Organized Health Care Education/Training Program | Primary: Internal Medicine

## 2022-07-16 ENCOUNTER — Encounter
Admit: 2022-07-16 | Payer: BLUE CROSS/BLUE SHIELD | Attending: Student in an Organized Health Care Education/Training Program | Primary: Internal Medicine

## 2022-08-20 ENCOUNTER — Encounter: Admit: 2022-08-20 | Payer: PRIVATE HEALTH INSURANCE | Primary: Internal Medicine

## 2022-10-01 ENCOUNTER — Ambulatory Visit: Admit: 2022-10-01 | Payer: MEDICAID | Attending: Internal Medicine | Primary: Internal Medicine

## 2022-10-01 ENCOUNTER — Inpatient Hospital Stay: Admit: 2022-10-01 | Discharge: 2022-10-01 | Payer: MEDICAID | Primary: Internal Medicine

## 2022-10-01 ENCOUNTER — Encounter
Admit: 2022-10-01 | Payer: MEDICAID | Attending: Student in an Organized Health Care Education/Training Program | Primary: Internal Medicine

## 2022-10-01 ENCOUNTER — Encounter: Admit: 2022-10-01 | Payer: PRIVATE HEALTH INSURANCE | Attending: Internal Medicine | Primary: Internal Medicine

## 2022-10-01 DIAGNOSIS — K56609 Unspecified intestinal obstruction, unspecified as to partial versus complete obstruction: Secondary | ICD-10-CM

## 2022-10-01 DIAGNOSIS — Z9889 Other specified postprocedural states: Secondary | ICD-10-CM

## 2022-10-01 DIAGNOSIS — K279 Peptic ulcer, site unspecified, unspecified as acute or chronic, without hemorrhage or perforation: Secondary | ICD-10-CM

## 2022-10-02 LAB — H. PYLORI BREATH TEST: BKR H. PYLORI BREATH TEST: POSITIVE — AB

## 2022-10-02 NOTE — Progress Notes
MRN: [id number] Date of Encounter: 10/27/2023Indication for Visit:  Follow-up (Pain after umbilical hernia repair ).   HPI: 60M with a h/o h. Pylori infection, gerd and repair of umbilical hernia s/p primary repair with Dr. Jeneen Montgomery on 07/02/22 with new abdominal pain and cramping when trying to have a bowel movement as well as blood in his stool. He states he has been seeing a GI doctor for this issues (last visit in epic everywhere was with [facility] in July) who requested surgical re-evaluation of umbilical hernia repair given periumbilical pain during these episodes. PAST MEDICAL: Past Medical History: Diagnosis Date ? Anxiety  ? Asthma  ? Sleep apnea   dx'd as child, no cpap, no further f/u, still symptomatic, encouraged to have recheck SURGICAL HISTORY:  Past Surgical History: Procedure Laterality Date ? TONSILLECTOMY AND ADENOIDECTOMY   ? UPPER GASTROINTESTINAL ENDOSCOPY   MEDICATIONS:  Current Outpatient Medications Medication Sig ? ALBUTEROL INHL Inhale into the lungs. ? cetirizine (ZYRTEC) 10 mg tablet Take 1 tablet (10 mg total) by mouth daily. ? dicyclomine (BENTYL) 20 mg tablet Take 1 tablet (20 mg total) by mouth every 6 (six) hours as needed (abdominal pain/cramping). ? oxyCODONE (ROXICODONE) 5 mg Immediate Release tablet Take 1 tablet (5 mg total) by mouth every 6 (six) hours as needed for pain. ? pantoprazole (PROTONIX) 40 mg tablet  ? sertraline (ZOLOFT) 50 mg tablet Take 1 tablet (50 mg total) by mouth daily. No current facility-administered medications for this visit. ALLERGIES: is allergic to peanut, peanut oil, tree nut, and seasonal [environmental allergies]. FAMILY HISTORY:   Family History Problem Relation Age of Onset ? No Known Problems Mother  ? No Known Problems Father  	SOCIAL HISTORY:  He reports that he has never smoked. He has never used smokeless tobacco. He reports current alcohol use. He reports current drug use. Drug: Marijuana.REVIEW OF SYSTEMS: ROS see hpiPHYSICAL EXAMINATION:VITAL SIGNS: BP (!) 150/86  - Pulse 86  - Ht 6' 3 (1.905 m)  - Wt 133.1 kg  - SpO2 99%  - BMI 36.68 kg/m?   Physical Exam Nad, mom bedsideresp unlaboredabd soft, nd, prior umbilical hernia site with palpable nodule ?suture without obvious fascial defect, mildly tender to deep palaption IMPRESSIONS & RECOMMENDATIONS:  60M with a h/o umbilical hernia repair referred to our office for re-evaluation of repair given ongoing abdominal problems-although not obvious on my exam and unlikely etiology, will get ctap to evaluate for recurrent hernia-will plan for video/telephone visit for results -patient and mom reassured me he is following with gi for ongoing abdominal issues-return precautions for obstructive symptoms discussed Dale Durham, MDAssistant Professor of SurgeryDivision of General Surgery, Trauma and Surgical Critical [employer] [facility] +  [address] [place], [place] [id number]: [id number]: [phone]

## 2022-10-07 ENCOUNTER — Encounter
Admit: 2022-10-07 | Payer: PRIVATE HEALTH INSURANCE | Attending: Student in an Organized Health Care Education/Training Program | Primary: Internal Medicine

## 2022-10-12 ENCOUNTER — Ambulatory Visit: Admit: 2022-10-12 | Payer: MEDICAID | Primary: Internal Medicine

## 2022-10-15 ENCOUNTER — Inpatient Hospital Stay: Admit: 2022-10-15 | Discharge: 2022-10-15 | Payer: MEDICAID | Primary: Internal Medicine

## 2022-10-15 DIAGNOSIS — Z9889 Other specified postprocedural states: Secondary | ICD-10-CM

## 2022-10-15 DIAGNOSIS — Z8719 Personal history of other diseases of the digestive system: Secondary | ICD-10-CM

## 2022-10-15 MED ORDER — IOHEXOL 240 MG IODINE/ML INTRAVENOUS SOLUTION
240 mg iodine/mL | Freq: Once | Status: CP | PRN
Start: 2022-10-15 — End: ?
  Administered 2022-10-15: 19:00:00 240 mL

## 2022-10-22 ENCOUNTER — Telehealth
Admit: 2022-10-22 | Payer: PRIVATE HEALTH INSURANCE | Attending: Student in an Organized Health Care Education/Training Program | Primary: Internal Medicine

## 2022-11-05 ENCOUNTER — Encounter
Admit: 2022-11-05 | Payer: PRIVATE HEALTH INSURANCE | Attending: Student in an Organized Health Care Education/Training Program | Primary: Internal Medicine

## 2022-11-05 ENCOUNTER — Encounter
Admit: 2022-11-05 | Payer: MEDICAID | Attending: Student in an Organized Health Care Education/Training Program | Primary: Internal Medicine

## 2022-11-05 DIAGNOSIS — J45909 Unspecified asthma, uncomplicated: Secondary | ICD-10-CM

## 2022-11-05 DIAGNOSIS — K36 Other appendicitis: Secondary | ICD-10-CM

## 2022-11-05 DIAGNOSIS — F419 Anxiety disorder, unspecified: Secondary | ICD-10-CM

## 2022-11-05 DIAGNOSIS — G4730 Sleep apnea, unspecified: Secondary | ICD-10-CM

## 2022-11-05 NOTE — Progress Notes
MRN: [id number] Date of Encounter: 12/1/2023Indication for Visit:  Follow-up (Umbilical Hernia).   HPI:  65M with a h/o h. Pylori infection, gerd and repair of umbilical hernia s/p primary repair with Dr. Jeneen Montgomery on 07/02/22 with new abdominal pain and cramping when trying to have a bowel movement as well as blood in his stool. He was seeing a GI doctor for this issues (last visit in epic everywhere was with [facility] in July) who requested surgical re-evaluation of umbilical hernia repair given periumbilical pain during these episodes. 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 Currently, he is doing well. Pain is better controlled. He is eating normally but still requests an appendectomy. PAST MEDICAL: Past Medical History: Diagnosis Date ? Anxiety  ? Asthma  ? Sleep apnea   dx'd as child, no cpap, no further f/u, still symptomatic, encouraged to have recheck SURGICAL HISTORY:  Past Surgical History: Procedure Laterality Date ? TONSILLECTOMY AND ADENOIDECTOMY   ? UPPER GASTROINTESTINAL ENDOSCOPY   MEDICATIONS:  Current Outpatient Medications Medication Sig ? ALBUTEROL INHL Inhale into the lungs. ? cetirizine (ZYRTEC) 10 mg tablet Take 1 tablet (10 mg total) by mouth daily. ? dicyclomine (BENTYL) 20 mg tablet Take 1 tablet (20 mg total) by mouth every 6 (six) hours as needed (abdominal pain/cramping). ? oxyCODONE (ROXICODONE) 5 mg Immediate Release tablet Take 1 tablet (5 mg total) by mouth every 6 (six) hours as needed for pain. ? pantoprazole (PROTONIX) 40 mg tablet  ? sertraline (ZOLOFT) 50 mg tablet Take 1 tablet (50 mg total) by mouth daily. No current facility-administered medications for this visit. ALLERGIES: is allergic to peanut, peanut oil, tree nut, and seasonal [environmental allergies]. FAMILY HISTORY:   Family History Problem Relation Age of Onset ? No Known Problems Mother  ? No Known Problems Father  	SOCIAL HISTORY:  He reports that he has never smoked. He has never used smokeless tobacco. He reports current alcohol use. He reports current drug use. Drug: Marijuana.REVIEW OF SYSTEMS: ROS see hpiPHYSICAL EXAMINATION:VITAL SIGNS: BP (!) 143/86 (Site: r a, Position: Sitting, Cuff Size: Medium)  - Pulse 87  - Ht 6' 3 (1.905 m)  - Wt 123.8 kg  - SpO2 97%  - BMI 34.12 kg/m?   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 We discussed at length the risks, benefits and alternatives to surgery, including that his abdominal cramping may not be do to appendicitis. He would still like to pursue surgery at this time. -plan lap appendectomy -consent to be scanned into chart Dale Durham, MDAssistant Professor of SurgeryDivision of General Surgery, Trauma and Surgical Critical [facility] [place] [address] [place], [place] [address]IONGEXBMW: [phone]: [phone]

## 2022-11-08 ENCOUNTER — Encounter: Admit: 2022-11-08 | Payer: PRIVATE HEALTH INSURANCE | Primary: Internal Medicine

## 2022-11-08 DIAGNOSIS — G4730 Sleep apnea, unspecified: Secondary | ICD-10-CM

## 2022-11-08 DIAGNOSIS — Z8619 Personal history of other infectious and parasitic diseases: Secondary | ICD-10-CM

## 2022-11-08 DIAGNOSIS — J45909 Unspecified asthma, uncomplicated: Secondary | ICD-10-CM

## 2022-11-08 DIAGNOSIS — F419 Anxiety disorder, unspecified: Secondary | ICD-10-CM

## 2022-11-08 DIAGNOSIS — K219 Gastro-esophageal reflux disease without esophagitis: Secondary | ICD-10-CM

## 2022-11-15 ENCOUNTER — Encounter: Admit: 2022-11-15 | Payer: PRIVATE HEALTH INSURANCE | Primary: Internal Medicine

## 2022-11-15 DIAGNOSIS — J45909 Unspecified asthma, uncomplicated: Secondary | ICD-10-CM

## 2022-11-15 DIAGNOSIS — F419 Anxiety disorder, unspecified: Secondary | ICD-10-CM

## 2022-11-15 DIAGNOSIS — G4730 Sleep apnea, unspecified: Secondary | ICD-10-CM

## 2022-11-15 DIAGNOSIS — Z8619 Personal history of other infectious and parasitic diseases: Secondary | ICD-10-CM

## 2022-11-15 DIAGNOSIS — K219 Gastro-esophageal reflux disease without esophagitis: Secondary | ICD-10-CM

## 2022-11-15 MED ORDER — SODIUM CHLORIDE 0.9 % (FLUSH) INJECTION SYRINGE
0.9 % | Freq: Three times a day (TID) | INTRAVENOUS | Status: DC
Start: 2022-11-15 — End: 2022-11-17
  Administered 2022-11-16: 17:00:00 0.9 mL via INTRAVENOUS

## 2022-11-15 MED ORDER — BENADRYL ALLERGY ORAL
Freq: Every day | ORAL | Status: AC | PRN
Start: 2022-11-15 — End: ?

## 2022-11-15 MED ORDER — LACTATED RINGERS INTRAVENOUS SOLUTION
INTRAVENOUS | Status: DC
Start: 2022-11-15 — End: 2022-11-17
  Administered 2022-11-16: 17:00:00 1000.000 mL/h via INTRAVENOUS

## 2022-11-15 MED ORDER — SODIUM CHLORIDE 0.9 % (FLUSH) INJECTION SYRINGE
0.9 % | INTRAVENOUS | Status: DC | PRN
Start: 2022-11-15 — End: 2022-11-17

## 2022-11-16 ENCOUNTER — Ambulatory Visit: Admit: 2022-11-16 | Payer: MEDICAID | Attending: Anesthesiology | Primary: Internal Medicine

## 2022-11-16 ENCOUNTER — Inpatient Hospital Stay
Admit: 2022-11-16 | Discharge: 2022-11-16 | Payer: MEDICAID | Attending: Student in an Organized Health Care Education/Training Program | Primary: Internal Medicine

## 2022-11-16 ENCOUNTER — Encounter: Admit: 2022-11-16 | Payer: PRIVATE HEALTH INSURANCE | Attending: Surgery | Primary: Internal Medicine

## 2022-11-16 DIAGNOSIS — G4730 Sleep apnea, unspecified: Secondary | ICD-10-CM

## 2022-11-16 DIAGNOSIS — F419 Anxiety disorder, unspecified: Secondary | ICD-10-CM

## 2022-11-16 DIAGNOSIS — K3589 Other acute appendicitis without perforation or gangrene: Secondary | ICD-10-CM

## 2022-11-16 DIAGNOSIS — Z79899 Other long term (current) drug therapy: Secondary | ICD-10-CM

## 2022-11-16 DIAGNOSIS — J45909 Unspecified asthma, uncomplicated: Secondary | ICD-10-CM

## 2022-11-16 DIAGNOSIS — K36 Other appendicitis: Secondary | ICD-10-CM

## 2022-11-16 DIAGNOSIS — K219 Gastro-esophageal reflux disease without esophagitis: Secondary | ICD-10-CM

## 2022-11-16 DIAGNOSIS — Z8619 Personal history of other infectious and parasitic diseases: Secondary | ICD-10-CM

## 2022-11-16 DIAGNOSIS — K37 Unspecified appendicitis: Secondary | ICD-10-CM

## 2022-11-16 MED ORDER — BUPIVACAINE (PF) 0.5 % (5 MG/ML) INJECTION SOLUTION
0.5 % (5 mg/mL) | Status: DC
Start: 2022-11-16 — End: 2022-11-17

## 2022-11-16 MED ORDER — ROCURONIUM 10 MG/ML INTRAVENOUS SOLUTION
10 mg/mL | INTRAVENOUS | Status: DC | PRN
Start: 2022-11-16 — End: 2022-11-16
  Administered 2022-11-16 (×2): 10 mg/mL via INTRAVENOUS

## 2022-11-16 MED ORDER — ONDANSETRON HCL (PF) 4 MG/2 ML INJECTION SOLUTION
42 mg/2 mL | INTRAVENOUS | Status: DC | PRN
Start: 2022-11-16 — End: 2022-11-16
  Administered 2022-11-16: 19:00:00 4 mg/2 mL via INTRAVENOUS

## 2022-11-16 MED ORDER — PROPOFOL 10 MG/ML INTRAVENOUS EMULSION
10 mg/mL | INTRAVENOUS | Status: DC | PRN
Start: 2022-11-16 — End: 2022-11-16
  Administered 2022-11-16 (×2): 10 mg/mL via INTRAVENOUS

## 2022-11-16 MED ORDER — LACTATED RINGERS INTRAVENOUS SOLUTION
INTRAVENOUS | Status: DC | PRN
Start: 2022-11-16 — End: 2022-11-16
  Administered 2022-11-16 (×2): via INTRAVENOUS

## 2022-11-16 MED ORDER — BUPIVACAINE LIPOSOME INJECTION (EXPAREL)
1.3 % (13.3 mg/mL) | Freq: Once | Status: DC
Start: 2022-11-16 — End: 2022-11-17

## 2022-11-16 MED ORDER — DEXAMETHASONE SODIUM PHOSPHATE 4 MG/ML INJECTION SOLUTION
4 mg/mL | INTRAVENOUS | Status: DC | PRN
Start: 2022-11-16 — End: 2022-11-16
  Administered 2022-11-16: 19:00:00 4 mg/mL via INTRAVENOUS

## 2022-11-16 MED ORDER — ACETAMINOPHEN 325 MG TABLET
325 mg | ORAL_TABLET | Freq: Four times a day (QID) | ORAL | 2 refills | Status: AC | PRN
Start: 2022-11-16 — End: ?

## 2022-11-16 MED ORDER — FENTANYL (PF) 50 MCG/ML INJECTION SOLUTION
50 mcg/mL | INTRAVENOUS | Status: DC | PRN
Start: 2022-11-16 — End: 2022-11-16
  Administered 2022-11-16 (×4): 50 mcg/mL via INTRAVENOUS

## 2022-11-16 MED ORDER — PROPOFOL 10 MG/ML INTRAVENOUS EMULSION
10 mg/mL | Status: DC | PRN
Start: 2022-11-16 — End: 2022-11-16
  Administered 2022-11-16: 19:00:00 10 mL/h

## 2022-11-16 MED ORDER — LACTATED RINGERS INTRAVENOUS SOLUTION
INTRAVENOUS | Status: DC
Start: 2022-11-16 — End: 2022-11-17

## 2022-11-16 MED ORDER — ACETAMINOPHEN 1,000 MG/100 ML (10 MG/ML) INTRAVENOUS SOLUTION
10 mg/mL | Freq: Once | INTRAVENOUS | Status: CP
Start: 2022-11-16 — End: ?
  Administered 2022-11-16: 22:00:00 10 mL/h via INTRAVENOUS

## 2022-11-16 MED ORDER — FENTANYL (PF) 50 MCG/ML INJECTION SOLUTION
50 mcg/mL | INTRAVENOUS | Status: DC | PRN
Start: 2022-11-16 — End: 2022-11-17

## 2022-11-16 MED ORDER — DROPERIDOL 2.5 MG/ML INJECTION SOLUTION
2.5 mg/mL | INTRAVENOUS | Status: DC | PRN
Start: 2022-11-16 — End: 2022-11-17

## 2022-11-16 MED ORDER — HEPARIN (PORCINE) 5,000 UNIT/ML INJECTION SOLUTION
5000 unit/mL | Freq: Once | SUBCUTANEOUS | Status: DC
Start: 2022-11-16 — End: 2022-11-17

## 2022-11-16 MED ORDER — SODIUM CHLORIDE 0.9 % INTRAVENOUS SOLUTION
Status: CP | PRN
Start: 2022-11-16 — End: ?
  Administered 2022-11-16: 20:00:00 via INTRAVENOUS

## 2022-11-16 MED ORDER — BUPIVACAINE (PF) 0.5 % (5 MG/ML) INJECTION SOLUTION
0.55 % (5 mg/mL) | Status: DC | PRN
Start: 2022-11-16 — End: 2022-11-16
  Administered 2022-11-16: 20:00:00 0.5 % (5 mg/mL)

## 2022-11-16 MED ORDER — SUGAMMADEX 100 MG/ML INTRAVENOUS SOLUTION
100 mg/mL | INTRAVENOUS | Status: DC | PRN
Start: 2022-11-16 — End: 2022-11-16
  Administered 2022-11-16: 21:00:00 100 mg/mL via INTRAVENOUS

## 2022-11-16 MED ORDER — MIDAZOLAM (PF) 1 MG/ML INJECTION SOLUTION
1 mg/mL | Status: CP
Start: 2022-11-16 — End: ?

## 2022-11-16 MED ORDER — OXYCODONE IMMEDIATE RELEASE 5 MG TABLET
5 mg | ORAL_TABLET | ORAL | 1 refills | Status: AC | PRN
Start: 2022-11-16 — End: ?

## 2022-11-16 MED ORDER — BUPIVACAINE LIPOSOME INJECTION (EXPAREL)
1.313.3 % (13.3 mg/mL) | Status: DC | PRN
Start: 2022-11-16 — End: 2022-11-16
  Administered 2022-11-16: 20:00:00 1.3 % (13.3 mg/mL)

## 2022-11-16 MED ORDER — FENTANYL (PF) 50 MCG/ML INJECTION SOLUTION
50 mcg/mL | INTRAVENOUS | Status: DC | PRN
Start: 2022-11-16 — End: 2022-11-17
  Administered 2022-11-16: 22:00:00 50 mL via INTRAVENOUS

## 2022-11-16 MED ORDER — DEXMEDETOMIDINE 100 MCG/ML INTRAVENOUS SOLUTION
100 mcg/mL | INTRAVENOUS | Status: DC | PRN
Start: 2022-11-16 — End: 2022-11-16
  Administered 2022-11-16 (×2): 100 mcg/mL via INTRAVENOUS

## 2022-11-16 MED ORDER — LIDOCAINE HCL 20 MG/ML (2 %) INJECTION SOLUTION
202 mg/mL (2 %) | INTRAVENOUS | Status: DC | PRN
Start: 2022-11-16 — End: 2022-11-16
  Administered 2022-11-16: 19:00:00 20 mg/mL (2 %) via INTRAVENOUS

## 2022-11-16 MED ORDER — MIDAZOLAM 1 MG/ML INJECTION SOLUTION
1 mg/mL | INTRAVENOUS | Status: DC | PRN
Start: 2022-11-16 — End: 2022-11-16
  Administered 2022-11-16: 19:00:00 1 mg/mL via INTRAVENOUS

## 2022-11-16 MED ORDER — IBUPROFEN 200 MG TABLET
200 mg | Freq: Four times a day (QID) | ORAL | Status: DC | PRN
Start: 2022-11-16 — End: 2022-11-17

## 2022-11-16 MED ORDER — METRONIDAZOLE IN NACL (ISO-OSM) 500 MG/100 ML IVPB (PEDIATRIC)
500100 mg/100 mL | INTRAVENOUS | Status: DC | PRN
Start: 2022-11-16 — End: 2022-11-16
  Administered 2022-11-16: 20:00:00 500 mg/100 mL via INTRAVENOUS

## 2022-11-16 MED ORDER — FENTANYL (PF) 50 MCG/ML INJECTION SOLUTION
50 mcg/mL | Status: CP
Start: 2022-11-16 — End: ?

## 2022-11-16 MED ORDER — OXYCODONE IMMEDIATE RELEASE 5 MG TABLET
5 mg | ORAL | Status: DC | PRN
Start: 2022-11-16 — End: 2022-11-17
  Administered 2022-11-16: 23:00:00 5 mg via ORAL

## 2022-11-16 MED ORDER — HYDROMORPHONE 0.5 MG/0.5 ML INJECTION SYRINGE
0.5 mg/ mL | INTRAVENOUS | Status: DC | PRN
Start: 2022-11-16 — End: 2022-11-17

## 2022-11-16 MED ORDER — ONDANSETRON HCL (PF) 4 MG/2 ML INJECTION SOLUTION
4 mg/2 mL | INTRAVENOUS | Status: DC | PRN
Start: 2022-11-16 — End: 2022-11-17

## 2022-11-16 MED ORDER — IBUPROFEN 600 MG TABLET
600 mg | ORAL_TABLET | Freq: Four times a day (QID) | ORAL | 1 refills | Status: AC | PRN
Start: 2022-11-16 — End: 2022-11-16

## 2022-11-16 MED ORDER — CEFAZOLIN 1 GRAM SOLUTION FOR INJECTION
1 gram | INTRAVENOUS | Status: DC | PRN
Start: 2022-11-16 — End: 2022-11-16
  Administered 2022-11-16: 19:00:00 1 gram via INTRAVENOUS

## 2022-11-16 MED ORDER — LACTATED RINGERS INTRAVENOUS SOLUTION
INTRAVENOUS | Status: DC
Start: 2022-11-16 — End: 2022-11-17
  Administered 2022-11-16: 21:00:00 1000.000 mL/h via INTRAVENOUS

## 2022-11-16 MED ORDER — POLYETHYLENE GLYCOL 3350 17 GRAM ORAL POWDER PACKET
17 gram | Freq: Every day | ORAL | 3 refills | Status: AC
Start: 2022-11-16 — End: ?

## 2022-11-16 MED ORDER — NALOXONE 0.4 MG/ML INJECTION SOLUTION
0.4 mg/mL | INTRAVENOUS | Status: DC | PRN
Start: 2022-11-16 — End: 2022-11-17

## 2022-11-16 NOTE — Anesthesia Post-Procedure Evaluation
Anesthesia Post-op NotePatient: Evan O LevyProcedure(s):  Procedure(s) (LRB):LAPAROSCOPIC APPENDECTOMY (N/A) Last Vitals:  I have reviewed the post-operative vital signs.POSTOP EVALUATION:      Patient Recovery Location:  PACU     Vital Signs Status:  Stable     Patient Participation:  Patient participated     Mental Status:  Awake     Respiratory Status:  Acceptable     Airway Patency:  Patent     Cardiovascular/Hydration Status:  Stable     Pain Management:  Satisfactory to patient     Nausea/Vomiting Status:  Satisfactory to patientThere were no known notable events for this encounter.

## 2022-11-16 NOTE — Discharge Instructions
Take acetaminophen around the clock for two days. Take oxycodone as needed for additional pain control. While taking oxycodone, please take miralax. Do not drive or operate heavy machinery while taking oxycodone. You may showe shower tomorrow. Keep the area clean and dry. Follow-up with me in 2 weeks in the office. Please call to schedule an appointment.

## 2022-11-16 NOTE — Brief Op Note
[facility] HealthPatient Name: Evan Colon        [id number] Patient DOB: [birth year]     Surgery Date: 12/12/2023Surgeon(s) and Role:   Dale Durham, MD - PrimaryAssistant(s):PA 1st Assist: Serena Colonel, PA Surgical PA was present for the entire case and assisted with exposure, retraction, hemostasis, dissection and wound closure. Staff:  Circulator: Randie Heinz, RNScrub Person: Doree Albee, RNMedical Student: Mervin Hack, STUDENTPre-Op Diagnosis: Chronic appendiceal inflammation. Procedure(s) and Anesthesia Type:   * LAPAROSCOPIC APPENDECTOMY - GENERALOperative Findings (enter relevant operative findings; do not refer to an operative report that is not yet transcribed): See MD dictation.Signs of infection present at the time of surgery at the operative site: None Blood and Blood Products: none                 Drains:  noneImplants: * No implants in log * Specimens: ID Type Source Tests Collected by Time 1 : Appendix Tissue Appendix SURGICAL PATHOLOGY ([facility]) Dale Durham, MD 11/16/2022  2:31 PM  Clinical Staging: N/AEBL: 100 mL      Post Operative Diagnosis: Chronic appendiceal inflammation Evan Bray Josemiguel Gries, PA-C12/12/20234:16 PM

## 2022-11-16 NOTE — Interval H&P Note
Subsequent to admission for surgery or invasive procedure, I have reassessed the patient by examination and review of relevant data pertaining to the planned procedure. I have verified the planned procedure and there are no relevant changes since the H&P. Evan Colon

## 2022-11-16 NOTE — Other
Post Anesthesia Transfer of Care NotePatient: Evan O LevyProcedure(s) Performed: Procedure(s) (LRB):LAPAROSCOPIC APPENDECTOMY (N/A) Last Vitals:  Vitals Value Taken Time BP 124/66 11/16/22 1607 Temp 36.8 11/16/22 1609 Pulse 82 11/16/22 1609 Resp 20 11/16/22 1609 SpO2 97 % 11/16/22 1609 Vitals shown include unvalidated device data.POSTOP HANDOFF :      Patient Location:  PACU     Level of Consciousness:  Responds to stimulation     VS stable since last recorded intra-op set? Yes       Oxygen source: maskIntra-operative Intake & Output and Antibiotics as per Anesthesia record and discussed with the RN.

## 2022-11-16 NOTE — Anesthesia Pre-Procedure Evaluation
This is a 21 y.o. male scheduled for LAPAROSCOPIC APPENDECTOMY.Review of Systems/ Medical HistoryPatient summary, nursing notes, EKG/Cardiac Studies , Labs and pre-procedure vitals, height, weight reviewed.Anesthesia Evaluation: Estimated body mass index is 34.12 kg/m? as calculated from the following:  Height as of 11/05/22: 6' 3 (1.905 m).  Weight as of 11/05/22: 123.8 kg. CC/HPI: Past Medical History:No date: AnxietyNo date: AsthmaNo date: GERD (gastroesophageal reflux disease)07/2022: History of Helicobacter pylori infectionNo date: Sleep apnea    Comment:  dx'd as child, no cpap, no further f/u, still              symptomatic, encouraged to have recheckPast Surgical History:  Past Surgical History:07/02/2022: OPEN UMBILICAL HERNIA REPAIR    Comment:  Dr. Margarette Asal date: TONSILLECTOMY AND ADENOIDECTOMYNo date: UPPER GASTROINTESTINAL ENDOSCOPYCardiovascular:-Exercise tolerance: >4 METS Gastrointestinal/Genitourinary: -Nutritional Disorders: Pt is obese per BMI definition-Physical ExamCardiovascular:    normal exam  Rhythm: regularPulmonary:  normal exam  Airway:  Mallampati: IITM distance: >3 FBNeck ROM: fullMouth Opening: >3cmDental:  unremarkable  Anesthesia PlanASA 2 The primary anesthesia plan is  general ETT. Perioperative Code Status confirmed: It is my understanding that the patient is currently designated as 'Full Code' and will remain so throughout the perioperative period.Anesthesia informed consent obtained. Anesthesia written consent obtainedConsent obtained from: patientUse of blood products: consented  The post operative pain plan is per surgeon management.Opioid administration likely.Plan discussed with CRNA and Attending.Anesthesiologist's Pre Op NoteI personally evaluated and examined the patient prior to the intra-operative phase of care on the day of the procedure.Marland Kitchen

## 2022-11-16 NOTE — Other
Operative Diagnosis:Pre-op:   * No pre-op diagnosis entered * Patient Coded Diagnosis   Pre-op diagnosis: Recurrent appendicitis  Post-op diagnosis: Recurrent appendicitis  Patient Diagnosis   None    Post-op diagnosis:   * Recurrent appendicitis [K36]Operative Procedure(s) :Procedure(s) (LRB):LAPAROSCOPIC APPENDECTOMY (N/A)Post-op Procedure & Diagnosis ConfirmationPost-op Diagnosis: Post-op Diagnosis confirmed (no changes)Post-op Procedure: Post-op Procedure confirmed (no changes)

## 2022-11-16 NOTE — Other
PATIENT NAME: Evan Rising LevyDATE: 12/12/2023SURGEON: Beatris Ship, MDASSISTANT: PA 1st Assist: SangiorgioStark Bray, PA.  PA was present for all phases of operation including exposure, retraction, hemostasis, dissection and wound closure as there are no qualified residents at this institution.PRE OP DX: chronic appendiceal inflammation POST OP DX: samePROCEDURE NAME: Procedure(s) (LRB):LAPAROSCOPIC APPENDECTOMY (N/A) ANESTHESIA: GeneralEBL:  100cc  COMPLICATIONS: noneINDICATIONS FOR THE PROCEDURE: 21M with ctap findings c/f chronic appendiceal inflammation. We discussed at length the risks, benefits and alternatives to surgery, including that his abdominal cramping may not be do to appendicitis. He would still like to pursue surgery at this time. DESCRIPTION OF PROCEDURE: The patient was taken back to the operating room and placed in supine position.  General anesthesia was induced without complication, perioperative antibiotics were given, prophylactic SCDs were placed. The abdomen was then prepped and draped in the standard sterile fashion and time-out procedure was performed.  0.25% Marcaine, exparel and normal saline were used for local anesthesia.  A 5 mm incision was made just superior and lateral to the umbilicus.  Using an optical entry technique a 5 mm trocar was inserted into the abdomen under direct laparoscopic visualization.  Upon entry the abdomen was insufflated to pressure of 15 mm Hg.  A 30 degree scope was then reinserted, the abdomen inspected and no injury noted from initial trocar placement. A TAP lock was performed. I then proceeded to place the following trocars; a 5 mm in the suprapubic region and a 12 mm in the left lateral umbilical region.  The patient was placed in the Trendelenburg position with the left side down.  Upon inspection the appendix was visualized.  I was able to grasp it and retracted towards the anterior abdominal wall.  The mesoappendix was then transected with LigaSure device.  This exposed the base of the appendix which I was able to transect using an Endo-GIA tan 45 mm load staple.  Care was taken to protect the terminal ileum.  Once the specimen was freed was placed in an Endo-Catch bag and retrieved through the 12 mm port site.  The staple line was inspected and appeared hemostatic.  All ports were then removed under direct laparoscopic visualization and the abdomen was desufflated. The 12mm incision had more than anticipated amount of bleeding from the port site, so it was closed using 2-0 vicryl and a carter thomason closure device. All skin incisions were closed with a 4-0 Monocryl subcuticular stitch and Dermabond was applied as a sterile dressing.  All sponge, needle, instrument counts were correct at the end of procedure.  The patient was extubated and taken to recovery room in stable condition with an abdominal binder in place. [name] tolerated the procedure well.  Dale Durham, MDAssistant Professor of SurgeryDivision of General Surgery, Trauma and Surgical Critical [facility] [place] [address] [place], [place] [id number]: [phone]: [phone]

## 2022-11-18 ENCOUNTER — Encounter
Admit: 2022-11-18 | Payer: MEDICAID | Attending: Student in an Organized Health Care Education/Training Program | Primary: Internal Medicine

## 2023-03-17 ENCOUNTER — Inpatient Hospital Stay: Admit: 2023-03-17 | Discharge: 2023-03-17 | Payer: MEDICAID

## 2023-03-17 ENCOUNTER — Emergency Department: Admit: 2023-03-17 | Payer: MEDICAID | Primary: Internal Medicine

## 2023-03-17 ENCOUNTER — Encounter: Admit: 2023-03-17 | Payer: PRIVATE HEALTH INSURANCE | Primary: Internal Medicine

## 2023-03-17 DIAGNOSIS — M79641 Pain in right hand: Secondary | ICD-10-CM

## 2023-03-17 DIAGNOSIS — J45909 Unspecified asthma, uncomplicated: Secondary | ICD-10-CM

## 2023-03-17 DIAGNOSIS — K219 Gastro-esophageal reflux disease without esophagitis: Secondary | ICD-10-CM

## 2023-03-17 DIAGNOSIS — F419 Anxiety disorder, unspecified: Secondary | ICD-10-CM

## 2023-03-17 DIAGNOSIS — Z8619 Personal history of other infectious and parasitic diseases: Secondary | ICD-10-CM

## 2023-03-17 DIAGNOSIS — G4730 Sleep apnea, unspecified: Secondary | ICD-10-CM

## 2023-03-17 DIAGNOSIS — K37 Unspecified appendicitis: Secondary | ICD-10-CM

## 2023-03-17 LAB — HIV-1/HIV-2 ANTIBODY/ANTIGEN SCREEN W/REFLEX     (BH GH LMW YH): BKR HIV AG/AB, 4TH GEN: NEGATIVE

## 2023-03-17 MED ORDER — IBUPROFEN 600 MG TABLET
600 mg | Freq: Once | ORAL | Status: CP
Start: 2023-03-17 — End: ?
  Administered 2023-03-17: 18:00:00 600 mg via ORAL

## 2023-03-17 NOTE — Discharge Instructions
You were evaluated in the emergency department for your right thumb pain.  Your x-ray does not show any broken bones, and the joint appears to be intact.  Please follow up with your primary care doctor to make sure that your symptoms are improving.  You can apply an Ace wrap or splint for comfort, apply ice, keep the right hand elevated and take ibuprofen 600 mg every 8 hours.  If you have persistent pain you can follow up with the hand specialist, you can call Dr. Shearon Balo' office to schedule an appointment.Return to the ER with any new or worsening symptoms.

## 2023-03-17 NOTE — ED Notes
2:35 PMAce wrap applied to affected area.2:38 PMPt cleared for D/C with instructions, follow-up with pcp and hand specialist and return for worsening s.sx given to pt and verbalized understanding.

## 2023-03-17 NOTE — ED Provider Notes
Chief Complaint Patient presents with  Thumb Injury   Pt states he dislocated rt thumb last night and his friend popped it back into place. Today rt thumb and distal joint is painful and swelling noted into metacarpal area. HPI/PE:This is a 22 year old male who presents to the ED for evaluation of right thumb pain.  He states he was play fighting with a friend yesterday, fell and landed on his right thumb, and in the process dislocated his right thumb.  His friend relocated the thumb for him and the pain in his thumb significantly improved.  However he was had ongoing pain at the base of his right thumb.  He reports no numbness or weakness, however he does have pain with moving his right thumb.  He did not hit his head and did not sustain any other injuries during the fall. Physical ExamED Triage Vitals [03/17/23 1318]BP: (!) 148/87Pulse: 86Pulse from  O2 sat: n/aResp: 16Temp: 97.7 ?F (36.5 ?C)Temp src: OralSpO2: 98 % BP (!) 148/87  - Pulse 86  - Temp 97.7 ?F (36.5 ?C) (Oral)  - Resp 16  - Ht 6' 3 (1.905 m)  - Wt 111.1 kg  - SpO2 98%  - BMI 30.62 kg/m? Physical ExamVitals and nursing note reviewed. Constitutional:     Appearance: Normal appearance. HENT:    Head: Normocephalic and atraumatic. Cardiovascular:    Rate and Rhythm: Normal rate and regular rhythm. Pulmonary:    Effort: Pulmonary effort is normal.    Breath sounds: Normal breath sounds. Musculoskeletal:       General: Tenderness present. No swelling.    Cervical back: Normal range of motion. No tenderness.    Comments: Tenderness at the MCP of the right thumb with decreased range of motion due to pain.  No swelling or erythema to the right thumb2+ radial and ulnar pulses on the right Neurological:    General: No focal deficit present.    Mental Status: He is alert.    Comments: Sensation is intact to all digits hand  MDMNumber of Diagnoses or Management OptionsPain of right thumbDiagnosis management comments: This is a 22 year old male who presents to the ED for evaluation of right thumb pain after he dislocated it yesterday while play fighting with a friend, who then relocated his thumb for him.  He was having ongoing pain and decreased range of motion of the right thumb.  On exam patient is well-appearing, there is no swelling, redness or warmth to the thumb or hand, he does have mildly decreased range of motion and tenderness at right 1st MCP, sensation is intact, radial and ulnar pulses are intact.Right hand x-ray ordered to dislocation/fracture, ibuprofen ordered for painX ray read by radiology as negative Will apply an ace wrap for comfort and give hand follow up if pain persists.  Patient was instructed to follow up with his PCP, and contact Dr. Shearon Balo if his pain persists. Patient is in agreement with the plan.XR Hand Right Final Result  No apparent acute bony injury.  Reported and signed by:  Vanice Sarah, MD   ProceduresAttestation/Critical CareClinical Impressions as of 03/17/23 1626 Pain of right thumb  ED DispositionDischargeSupervisionI have evaluated this patient; My supervising physician was available for consultation. Graylin Shiver, PA04/11/24 1628

## 2023-03-23 ENCOUNTER — Ambulatory Visit: Admit: 2023-03-23 | Payer: MEDICAID | Attending: Otolaryngology | Primary: Internal Medicine

## 2023-04-21 ENCOUNTER — Emergency Department: Admit: 2023-04-21 | Payer: MEDICAID | Primary: Internal Medicine

## 2023-04-21 ENCOUNTER — Encounter: Admit: 2023-04-21 | Payer: PRIVATE HEALTH INSURANCE | Primary: Internal Medicine

## 2023-04-21 ENCOUNTER — Inpatient Hospital Stay: Admit: 2023-04-21 | Discharge: 2023-04-21 | Payer: MEDICAID

## 2023-04-21 DIAGNOSIS — K37 Unspecified appendicitis: Secondary | ICD-10-CM

## 2023-04-21 DIAGNOSIS — M549 Dorsalgia, unspecified: Secondary | ICD-10-CM

## 2023-04-21 DIAGNOSIS — M545 Low back pain, unspecified: Secondary | ICD-10-CM

## 2023-04-21 DIAGNOSIS — R0781 Pleurodynia: Secondary | ICD-10-CM

## 2023-04-21 DIAGNOSIS — K219 Gastro-esophageal reflux disease without esophagitis: Secondary | ICD-10-CM

## 2023-04-21 DIAGNOSIS — G4730 Sleep apnea, unspecified: Secondary | ICD-10-CM

## 2023-04-21 DIAGNOSIS — J45909 Unspecified asthma, uncomplicated: Secondary | ICD-10-CM

## 2023-04-21 DIAGNOSIS — F419 Anxiety disorder, unspecified: Secondary | ICD-10-CM

## 2023-04-21 DIAGNOSIS — Z8619 Personal history of other infectious and parasitic diseases: Secondary | ICD-10-CM

## 2023-04-21 MED ORDER — KETOROLAC 30 MG/ML (1 ML) INJECTION SOLUTION
301 mg/mL (1 mL) | Freq: Once | INTRAMUSCULAR | Status: CP
Start: 2023-04-21 — End: ?
  Administered 2023-04-21: 10:00:00 30 mL via INTRAMUSCULAR

## 2023-04-21 MED ORDER — IBUPROFEN 600 MG TABLET
600 mg | ORAL_TABLET | Freq: Four times a day (QID) | ORAL | 1 refills | Status: AC | PRN
Start: 2023-04-21 — End: ?

## 2023-04-21 MED ORDER — ACETAMINOPHEN 500 MG TABLET
500 mg | Freq: Once | ORAL | Status: CP
Start: 2023-04-21 — End: ?
  Administered 2023-04-21: 10:00:00 500 mg via ORAL

## 2023-04-21 MED ORDER — CYCLOBENZAPRINE 10 MG TABLET
10 mg | ORAL_TABLET | Freq: Three times a day (TID) | ORAL | 1 refills | Status: AC | PRN
Start: 2023-04-21 — End: ?

## 2023-04-21 NOTE — ED Notes
6:52 AM Assumed care of pt at this time, he is up ambulating to the bathroom.

## 2023-04-21 NOTE — ED Notes
6:44 AM Pt resting comfortably with friends at bedside. Awaiting X-rays.6:49 AMReport given to Florentina Addison, Charity fundraiser.
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
## 2023-04-26 ENCOUNTER — Ambulatory Visit: Admit: 2023-04-26 | Payer: MEDICAID | Attending: Otolaryngology | Primary: Internal Medicine
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
## 2023-09-08 ENCOUNTER — Inpatient Hospital Stay: Admit: 2023-09-08 | Discharge: 2023-09-08 | Payer: MEDICAID

## 2023-09-09 LAB — NEISSERIA GONORRHEA, NAAT (LAB ORDER ONLY)   (BH GH L LMW YH): BKR NEISSERIA GONORRHOEAE, DNA PROBE: NEGATIVE

## 2023-09-09 LAB — CHLAMYDIA TRACHOMATIS, NAAT (LAB ORDER ONLY) (BH GH L LMW YH): BKR CHLAMYDIA, DNA PROBE: NEGATIVE

## 2023-09-09 NOTE — ED Provider Notes
 Chief Complaint Patient presents with  Std Check   Patient notified by previous partner that reports having chlamydia. Patient states it has been months since last sexual contact.. requesting STD testing. Denies signs or symptoms. HPI/PE:22 year old male presents with concern of STD exposure.  He states he found out a previous partner tested positive today for chlamydia.  He is having no symptoms.  He states there has been no contact sexually for the last 2-3 months.Exam finds his vital signs stable no apparent distress.  Genital exam declinedMDM:Urine is sent off for gonorrhea and chlamydia testing.  He is asymptomatic no indications for treatment here in the department.  He is also be trended.An acute or life threatening problem was considered during this evaluation   Vitals:  09/08/23 2102 BP: 127/87 Pulse: 86 Resp: 18 Temp: 98.4 ?F (36.9 ?C) TempSrc: Oral SpO2: 98% Weight: 114.3 kg Height: 6' 3 (1.905 m) Attestation/Critical CareClinical Impressions as of 09/08/23 2139 STD exposure DispoDischargeSupervisionI have evaluated this patient; My supervising physician was available for consultation. Myra Rude, PA10/03/24 2140

## 2023-10-12 ENCOUNTER — Inpatient Hospital Stay: Admit: 2023-10-12 | Discharge: 2023-10-12 | Payer: MEDICAID

## 2023-10-12 ENCOUNTER — Encounter: Admit: 2023-10-12 | Payer: PRIVATE HEALTH INSURANCE | Primary: Internal Medicine

## 2023-10-12 DIAGNOSIS — F419 Anxiety disorder, unspecified: Secondary | ICD-10-CM

## 2023-10-12 DIAGNOSIS — K219 Gastro-esophageal reflux disease without esophagitis: Secondary | ICD-10-CM

## 2023-10-12 DIAGNOSIS — J45909 Unspecified asthma, uncomplicated: Secondary | ICD-10-CM

## 2023-10-12 DIAGNOSIS — G4730 Sleep apnea, unspecified: Secondary | ICD-10-CM

## 2023-10-12 DIAGNOSIS — K37 Unspecified appendicitis: Secondary | ICD-10-CM

## 2023-10-12 DIAGNOSIS — Z8619 Personal history of other infectious and parasitic diseases: Secondary | ICD-10-CM
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
## 2023-10-12 NOTE — ED Notes
 Pt discharged by Myra Rude PA. Pt verbalized understanding of discharge instructions.
